# Patient Record
Sex: Female | Born: 1990 | Race: Black or African American | Hispanic: No | Marital: Single | Smoking: Never smoker
Health system: Southern US, Community
[De-identification: ages and names within clinical notes are randomized; demographics above are authoritative.]

## PROBLEM LIST (undated history)

## (undated) ENCOUNTER — Inpatient Hospital Stay (HOSPITAL_COMMUNITY): Payer: Self-pay

## (undated) DIAGNOSIS — Z789 Other specified health status: Secondary | ICD-10-CM

## (undated) HISTORY — PX: NO PAST SURGERIES: SHX2092

## (undated) HISTORY — PX: WISDOM TOOTH EXTRACTION: SHX21

---
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
## 2009-02-15 ENCOUNTER — Ambulatory Visit: Payer: Self-pay | Admitting: Family Medicine

## 2009-02-15 ENCOUNTER — Encounter (INDEPENDENT_AMBULATORY_CARE_PROVIDER_SITE_OTHER): Payer: Self-pay | Admitting: Family Medicine

## 2009-02-15 DIAGNOSIS — N946 Dysmenorrhea, unspecified: Secondary | ICD-10-CM | POA: Insufficient documentation

## 2009-02-15 LAB — CONVERTED CEMR LAB
GC Probe Amp, Genital: NEGATIVE
Whiff Test: NEGATIVE

## 2009-02-17 ENCOUNTER — Encounter (INDEPENDENT_AMBULATORY_CARE_PROVIDER_SITE_OTHER): Payer: Self-pay | Admitting: Family Medicine

## 2010-02-23 ENCOUNTER — Emergency Department (HOSPITAL_COMMUNITY): Admission: EM | Admit: 2010-02-23 | Discharge: 2010-02-23 | Payer: Self-pay | Admitting: Emergency Medicine

## 2010-12-12 ENCOUNTER — Encounter: Payer: Self-pay | Admitting: *Deleted

## 2012-06-05 ENCOUNTER — Encounter: Payer: Self-pay | Admitting: Family Medicine

## 2012-06-05 ENCOUNTER — Ambulatory Visit (INDEPENDENT_AMBULATORY_CARE_PROVIDER_SITE_OTHER): Payer: Self-pay | Admitting: Family Medicine

## 2012-06-05 VITALS — BP 114/80 | HR 80 | Ht 63.0 in | Wt 114.0 lb

## 2012-06-05 DIAGNOSIS — N946 Dysmenorrhea, unspecified: Secondary | ICD-10-CM

## 2012-06-05 DIAGNOSIS — IMO0001 Reserved for inherently not codable concepts without codable children: Secondary | ICD-10-CM

## 2012-06-05 DIAGNOSIS — Z309 Encounter for contraceptive management, unspecified: Secondary | ICD-10-CM

## 2012-06-05 LAB — POCT URINE PREGNANCY: Preg Test, Ur: NEGATIVE

## 2012-06-05 MED ORDER — NORGESTIMATE-ETH ESTRADIOL 0.25-35 MG-MCG PO TABS: 1.0000 | ORAL_TABLET | Freq: Every day | ORAL | Status: DC

## 2012-06-05 NOTE — Progress Notes (Signed)
  Subjective:    Patient ID: Jamie Wells, female    DOB: 1991-09-17, 21 y.o.   MRN: [id number]  HPI Patient here for dysmenorrhea complaint.  Last seen in this office in April 2010 by Dr. Luz Brazen, thus a new visit today.   She reports that she had done well with generic Sprintec at the time of her last visit.  Took for 4 months and then ran out.  Has not been on other forms of hormonal BC since then.  Had been sexually active with boyfriend, with whom she just broke up.  Not sexually active now, is not anticipating being sexually active now. No hx of STI, no hx pregnancy.  No other hormonal OCPs before the Sprintec in 2010.   Had PAP smear then that was normal.  No other PAP since then.   PMHx; None; no hospitalizations, no recent illnesses, no medications, no med allergies.   Family Hx; No DM, no forms of cancer; MGM with HTN.   No VTE events in family.   Social Hx: Works two jobs now [employer]).  Never smoked.  No alcohol or drugs. Not sexually active.    Review of Systems  No fevers/chills, no cough, no dyspnea, no chest pain, no diarrhea, no dysuria.  LMP was July 2-5, 2013.  Penultimate MP June 1-4, 2013.  May vary by 2-3 days each cycle.  Accompanied by pelvic pain.  First day heavy bleeding, then tapers off over next 2-3 days.      Objective:   Physical Exam Well appearing, no apparent distress HEENT Neck supple without adenopathy COR Regular S1S2 PULM Clear bilaterally.  EXTS no calf tenderness, no cords or erythema.        Assessment & Plan:

## 2012-06-05 NOTE — Assessment & Plan Note (Signed)
Patient requests OCPs for this.  She is not sexually active at this time.  No prior STI history, no sxs of STI.  Will refill Sprintec on $9 plan at [facility]; she is to return in 2 months for Pap and to discuss whether OCPs are helping with dysmenorrhea.  UPreg today is negative.  STI screening at time of Pap as well.  She agrees to plan.

## 2012-06-05 NOTE — Patient Instructions (Addendum)
It was a pleasure to see you today.  I sent the prescription for generic Sprintec to the [facility] on [address].  I would like to have you back for a pap smear in about 2 months, to see if your cycles are regulated well and the pain is controlled.

## 2012-08-04 ENCOUNTER — Ambulatory Visit: Payer: Self-pay | Admitting: Family Medicine

## 2012-12-24 ENCOUNTER — Other Ambulatory Visit: Payer: Self-pay | Admitting: Family Medicine

## 2013-01-24 ENCOUNTER — Other Ambulatory Visit: Payer: Self-pay | Admitting: Family Medicine

## 2013-01-25 ENCOUNTER — Telehealth: Payer: Self-pay | Admitting: Family Medicine

## 2013-01-25 MED ORDER — NORGESTIMATE-ETH ESTRADIOL 0.25-35 MG-MCG PO TABS: ORAL_TABLET | ORAL | Status: DC

## 2013-01-25 NOTE — Telephone Encounter (Signed)
To [facility] red team - please call pt and let her know I have refilled one month of her birth control, but she should schedule an appointment to be seen for further refills. She is due for a pap smear and was supposed to follow up around September 2013 but has not been seen since July 2013.

## 2013-01-25 NOTE — Telephone Encounter (Signed)
Left message for patient to return call, please read message from Dr McIntyre.Busick, Robert Lee  

## 2013-01-28 NOTE — Telephone Encounter (Signed)
Called again and left message to call us back. (do not see upcoming appt) .Jamie Wells

## 2013-02-21 ENCOUNTER — Other Ambulatory Visit: Payer: Self-pay | Admitting: Family Medicine

## 2013-02-22 ENCOUNTER — Other Ambulatory Visit: Payer: Self-pay | Admitting: Family Medicine

## 2013-02-22 ENCOUNTER — Encounter: Payer: Self-pay | Admitting: Family Medicine

## 2013-02-22 MED ORDER — NORGESTIMATE-ETH ESTRADIOL 0.25-35 MG-MCG PO TABS: ORAL_TABLET | ORAL | Status: DC

## 2013-04-11 ENCOUNTER — Other Ambulatory Visit: Payer: Self-pay | Admitting: Family Medicine

## 2013-06-11 ENCOUNTER — Ambulatory Visit (INDEPENDENT_AMBULATORY_CARE_PROVIDER_SITE_OTHER): Payer: Self-pay | Admitting: Family Medicine

## 2013-06-11 ENCOUNTER — Other Ambulatory Visit (HOSPITAL_COMMUNITY)
Admission: RE | Admit: 2013-06-11 | Discharge: 2013-06-11 | Disposition: A | Discharge status: Home / Self Care | Payer: Self-pay | Source: Ambulatory Visit | Attending: Family Medicine | Admitting: Family Medicine

## 2013-06-11 ENCOUNTER — Encounter: Payer: Self-pay | Admitting: Family Medicine

## 2013-06-11 VITALS — BP 122/84 | HR 90 | Ht 63.0 in | Wt 118.0 lb

## 2013-06-11 DIAGNOSIS — Z113 Encounter for screening for infections with a predominantly sexual mode of transmission: Secondary | ICD-10-CM

## 2013-06-11 DIAGNOSIS — Z309 Encounter for contraceptive management, unspecified: Secondary | ICD-10-CM

## 2013-06-11 DIAGNOSIS — Z01419 Encounter for gynecological examination (general) (routine) without abnormal findings: Secondary | ICD-10-CM | POA: Insufficient documentation

## 2013-06-11 DIAGNOSIS — Z124 Encounter for screening for malignant neoplasm of cervix: Secondary | ICD-10-CM

## 2013-06-11 DIAGNOSIS — N898 Other specified noninflammatory disorders of vagina: Secondary | ICD-10-CM

## 2013-06-11 DIAGNOSIS — R011 Cardiac murmur, unspecified: Secondary | ICD-10-CM | POA: Insufficient documentation

## 2013-06-11 LAB — POCT URINALYSIS DIPSTICK
Glucose, UA: NEGATIVE
Urobilinogen, UA: 0.2

## 2013-06-11 LAB — POCT WET PREP (WET MOUNT): Clue Cells Wet Prep Whiff POC: NEGATIVE

## 2013-06-11 MED ORDER — FLUCONAZOLE 150 MG PO TABS: 150.0000 mg | ORAL_TABLET | Freq: Once | ORAL | Status: DC

## 2013-06-11 MED ORDER — NORGESTIMATE-ETH ESTRADIOL 0.25-35 MG-MCG PO TABS: ORAL_TABLET | ORAL | Status: DC

## 2013-06-11 NOTE — Assessment & Plan Note (Signed)
Slight flow murmur auscultated on exam today. Patient is asymptomatic. Will continue to monitor at future visits. No workup indicated at this time.

## 2013-06-11 NOTE — Progress Notes (Signed)
Patient ID: Jamie Wells, female   DOB: 1990/11/15, 22 y.o.   MRN: [id number]   HPI:  Well woman visit: here for pap smear and to get birth control refilled. Is not currently sexually active. Was sexually active at one time with one partner, but not now. Interested in STD testing today. Previously was on Sprintec due to pain with periods. The OCP helped a lot. No side effects. No personal or family hx of blood clots. No personal hx of migraine with aura. Denies tobacco use. Not concerned about pregnancy today. Does think she has a yeast infection because she has felt itchy and had white discharge. Some burning with urination. No abdominal pain. Has never had pap smear before. Was good about taking the sprintec every day.  ROS: See HPI. Also, denies chest pain, SOB.   PMFSH: Does not smoke  PHYSICAL EXAM: BP 122/84  Pulse 90  Ht 5\' 3"  (1.6 m)  Wt 118 lb (53.524 kg)  BMI 20.91 kg/m2 Gen: NAD Heart: RRR, soft 2/6 flow murmur loudest when lying down Lungs: CTAB, NWOB Neuro: grossly nonfocal, speech intact GU: normal appearing external genitalia. Normal vaginal mucosa. Some white discharge present in vault. Cervix normal in appearance, consistent with nulliparous cervix. No tenderness on bimanual exam. No adnexal masses appreciable.   ASSESSMENT/PLAN:  # Health maintenance:  -pap smear done today -for STD screening will check wet prep, gc/chlamydia. Had planned to get HIV & RPR, however this didn't get ordered during visit, so will have nursing staff call patient and offer for her to return for HIV/RPR lab draw visit -urine pregnancy test negative, and patient desires refill of OCP. No contraindications present. Refilled for 1 years worth. -given handout on well woman health  # Burning/discharge: -wet prep consistent with yeast infection. Will treat with diflucan 150mg  PO x 1. -also obtain UA due to hx of dysuria, doubt UTI though as hx seems more like vaginal candidiasis

## 2013-06-11 NOTE — Patient Instructions (Addendum)
It was nice to meet you today!  I will call you if your test results are not normal.  Otherwise, I will send you a letter.  If you do not hear from me with in 2 weeks please call our office.  It looks like you have a yeast infection. I sent in a pill for you to take. Call us or come back if it's not improved.  I refilled your birth control for 12 months. Come back in a year for routine follow up, or sooner if you have any problems.  Be well, Dr. Pollie Meyer   Health Maintenance, Females A healthy lifestyle and preventative care can promote health and wellness.  Maintain regular health, dental, and eye exams.  Eat a healthy diet. Foods like vegetables, fruits, whole grains, low-fat dairy products, and lean protein foods contain the nutrients you need without too many calories. Decrease your intake of foods high in solid fats, added sugars, and salt. Get information about a proper diet from your caregiver, if necessary.  Regular physical exercise is one of the most important things you can do for your health. Most adults should get at least 150 minutes of moderate-intensity exercise (any activity that increases your heart rate and causes you to sweat) each week. In addition, most adults need muscle-strengthening exercises on 2 or more days a week.   Maintain a healthy weight. The body mass index (BMI) is a screening tool to identify possible weight problems. It provides an estimate of body fat based on height and weight. Your caregiver can help determine your BMI, and can help you achieve or maintain a healthy weight. For adults 20 years and older:  A BMI below 18.5 is considered underweight.  A BMI of 18.5 to 24.9 is normal.  A BMI of 25 to 29.9 is considered overweight.  A BMI of 30 and above is considered obese.  Maintain normal blood lipids and cholesterol by exercising and minimizing your intake of saturated fat. Eat a balanced diet with plenty of fruits and vegetables. Blood tests  for lipids and cholesterol should begin at age 105 and be repeated every 5 years. If your lipid or cholesterol levels are high, you are over 50, or you are a high risk for heart disease, you may need your cholesterol levels checked more frequently.Ongoing high lipid and cholesterol levels should be treated with medicines if diet and exercise are not effective.  If you smoke, find out from your caregiver how to quit. If you do not use tobacco, do not start.  If you are pregnant, do not drink alcohol. If you are breastfeeding, be very cautious about drinking alcohol. If you are not pregnant and choose to drink alcohol, do not exceed 1 drink per day. One drink is considered to be 12 ounces (355 mL) of beer, 5 ounces (148 mL) of wine, or 1.5 ounces (44 mL) of liquor.  Avoid use of street drugs. Do not share needles with anyone. Ask for help if you need support or instructions about stopping the use of drugs.  High blood pressure causes heart disease and increases the risk of stroke. Blood pressure should be checked at least every 1 to 2 years. Ongoing high blood pressure should be treated with medicines, if weight loss and exercise are not effective.  If you are 54 to 24 years old, ask your caregiver if you should take aspirin to prevent strokes.  Diabetes screening involves taking a blood sample to check your fasting blood sugar level.  This should be done once every 3 years, after age 61, if you are within normal weight and without risk factors for diabetes. Testing should be considered at a younger age or be carried out more frequently if you are overweight and have at least 1 risk factor for diabetes.  Breast cancer screening is essential preventative care for women. You should practice "breast self-awareness." This means understanding the normal appearance and feel of your breasts and may include breast self-examination. Any changes detected, no matter how small, should be reported to a caregiver.  Women in their 43s and 30s should have a clinical breast exam (CBE) by a caregiver as part of a regular health exam every 1 to 3 years. After age 67, women should have a CBE every year. Starting at age 50, women should consider having a mammogram (breast X-ray) every year. Women who have a family history of breast cancer should talk to their caregiver about genetic screening. Women at a high risk of breast cancer should talk to their caregiver about having an MRI and a mammogram every year.  The Pap test is a screening test for cervical cancer. Women should have a Pap test starting at age 54. Between ages 62 and 1, Pap tests should be repeated every 2 years. Beginning at age 71, you should have a Pap test every 3 years as long as the past 3 Pap tests have been normal. If you had a hysterectomy for a problem that was not cancer or a condition that could lead to cancer, then you no longer need Pap tests. If you are between ages 69 and 25, and you have had normal Pap tests going back 10 years, you no longer need Pap tests. If you have had past treatment for cervical cancer or a condition that could lead to cancer, you need Pap tests and screening for cancer for at least 20 years after your treatment. If Pap tests have been discontinued, risk factors (such as a new sexual partner) need to be reassessed to determine if screening should be resumed. Some women have medical problems that increase the chance of getting cervical cancer. In these cases, your caregiver may recommend more frequent screening and Pap tests.  The human papillomavirus (HPV) test is an additional test that may be used for cervical cancer screening. The HPV test looks for the virus that can cause the cell changes on the cervix. The cells collected during the Pap test can be tested for HPV. The HPV test could be used to screen women aged 64 years and older, and should be used in women of any age who have unclear Pap test results. After the age of  58, women should have HPV testing at the same frequency as a Pap test.  Colorectal cancer can be detected and often prevented. Most routine colorectal cancer screening begins at the age of 55 and continues through age 74. However, your caregiver may recommend screening at an earlier age if you have risk factors for colon cancer. On a yearly basis, your caregiver may provide home test kits to check for hidden blood in the stool. Use of a small camera at the end of a tube, to directly examine the colon (sigmoidoscopy or colonoscopy), can detect the earliest forms of colorectal cancer. Talk to your caregiver about this at age 61, when routine screening begins. Direct examination of the colon should be repeated every 5 to 10 years through age 59, unless early forms of pre-cancerous polyps or small  growths are found.  Hepatitis C blood testing is recommended for all people born from [birth year] through 1967 and any individual with known risks for hepatitis C.  Practice safe sex. Use condoms and avoid high-risk sexual practices to reduce the spread of sexually transmitted infections (STIs). Sexually active women aged 78 and younger should be checked for Chlamydia, which is a common sexually transmitted infection. Older women with new or multiple partners should also be tested for Chlamydia. Testing for other STIs is recommended if you are sexually active and at increased risk.  Osteoporosis is a disease in which the bones lose minerals and strength with aging. This can result in serious bone fractures. The risk of osteoporosis can be identified using a bone density scan. Women ages 58 and over and women at risk for fractures or osteoporosis should discuss screening with their caregivers. Ask your caregiver whether you should be taking a calcium supplement or vitamin D to reduce the rate of osteoporosis.  Menopause can be associated with physical symptoms and risks. Hormone replacement therapy is available to decrease  symptoms and risks. You should talk to your caregiver about whether hormone replacement therapy is right for you.  Use sunscreen with a sun protection factor (SPF) of 30 or greater. Apply sunscreen liberally and repeatedly throughout the day. You should seek shade when your shadow is shorter than you. Protect yourself by wearing long sleeves, pants, a wide-brimmed hat, and sunglasses year round, whenever you are outdoors.  Notify your caregiver of new moles or changes in moles, especially if there is a change in shape or color. Also notify your caregiver if a mole is larger than the size of a pencil eraser.  Stay current with your immunizations. Document Released: 05/13/2011 Document Revised: 01/20/2012 Document Reviewed: 05/13/2011 [facility] Patient Information 2014 [place], [place].

## 2013-06-13 ENCOUNTER — Telehealth: Payer: Self-pay | Admitting: Family Medicine

## 2013-06-13 NOTE — Telephone Encounter (Signed)
[facility] red team - can you please call this pt and offer for her to return to get HIV, RPR drawn to complete STD screening? We meant to do this at her visit on Friday but it wasn't drawn before she left. Thanks!

## 2013-06-14 NOTE — Telephone Encounter (Signed)
LMOVM for patient to call our office.  Pt needs lab work.  See MD msg below.  Please schedule pt for a lab visit when she call backs. Thank you.  Virga Haltiwanger, Darlyne Russian, CMA

## 2013-06-14 NOTE — Telephone Encounter (Signed)
Attempted to call pt at home.  No answer.  Con Arganbright, Darlyne Russian, CMA

## 2013-06-16 ENCOUNTER — Encounter: Payer: Self-pay | Admitting: Family Medicine

## 2013-08-06 ENCOUNTER — Other Ambulatory Visit: Payer: Self-pay | Admitting: Family Medicine

## 2015-06-05 ENCOUNTER — Encounter: Payer: Self-pay | Admitting: Family Medicine

## 2015-06-19 ENCOUNTER — Ambulatory Visit (INDEPENDENT_AMBULATORY_CARE_PROVIDER_SITE_OTHER): Payer: PRIVATE HEALTH INSURANCE | Admitting: Family Medicine

## 2015-06-19 ENCOUNTER — Encounter: Payer: Self-pay | Admitting: Family Medicine

## 2015-06-19 VITALS — BP 119/79 | HR 79 | Temp 98.5°F | Ht 63.0 in | Wt 117.9 lb

## 2015-06-19 DIAGNOSIS — R011 Cardiac murmur, unspecified: Secondary | ICD-10-CM

## 2015-06-19 DIAGNOSIS — N946 Dysmenorrhea, unspecified: Secondary | ICD-10-CM

## 2015-06-19 DIAGNOSIS — Z23 Encounter for immunization: Secondary | ICD-10-CM

## 2015-06-19 DIAGNOSIS — Z114 Encounter for screening for human immunodeficiency virus [HIV]: Secondary | ICD-10-CM | POA: Diagnosis not present

## 2015-06-19 MED ORDER — NORGESTIMATE-ETH ESTRADIOL 0.25-35 MG-MCG PO TABS: ORAL_TABLET | ORAL | Status: DC

## 2015-06-19 NOTE — Assessment & Plan Note (Signed)
Refill OCP

## 2015-06-19 NOTE — Progress Notes (Signed)
Patient ID: Jamie Wells, female   DOB: October 12, 1991, 24 y.o.   MRN: [id number] Well woman visit: Here for birth control refill. Has not been taking this for the past year but has continued to have pain with periods which are generally regular, lasting 3 - 6 days. She takes NSAIDs as needed. No sexual partners, declines STI screening. Reports good control with sprintec OCP without side effects and would like this same medication again. No personal or family hx of blood clots. No personal hx of migraine with aura. No dyspnea, leg swelling, vaginal bleeding, discharge, or irritation. No dysuria.   ROS: as above PMFSH: Non-smoker, no FH clots. Does not believe she's had a tetanus shot in the past 10 years.   PHYSICAL EXAM: BP 119/79 mmHg  Pulse 79  Temp(Src) 98.5 F (36.9 C) (Oral)  Ht  (1.6 m)  Wt 117 lb 14.4 oz (53.479 kg)  BMI 20.89 kg/m2  LMP 05/31/2015  Gen: NAD Heart: RRR, no murmur (as noted previously) was heard Lungs: CTAB, NWOB  ASSESSMENT/PLAN: Jamie Wells is a 24 y.o. female with dysmenorrhea, requesting birth control pills. - Refill OCPs x 12 months - Return for pap smear Aug 2017 - HIV screening - TDaP today

## 2015-06-19 NOTE — Patient Instructions (Signed)
Thank you for coming in today!  I have refilled a year worth of sprintec - sent to your pharmacy. Your next pap smear will be due in 2017.   Our clinic's number is [phone]. Feel free to call any time with questions or concerns. We will answer any questions after hours with our 24-hour emergency line at that number as well.   - Dr. Jarvis Newcomer

## 2015-06-19 NOTE — Addendum Note (Signed)
Addended by: Pamelia Hoit on: 06/19/2015 04:40 PM   Modules accepted: Orders

## 2015-06-19 NOTE — Assessment & Plan Note (Signed)
Likely flow murmur as it is absent on today's exam.

## 2015-06-20 ENCOUNTER — Telehealth: Payer: Self-pay | Admitting: Family Medicine

## 2015-06-20 DIAGNOSIS — N946 Dysmenorrhea, unspecified: Secondary | ICD-10-CM

## 2015-06-20 LAB — HIV ANTIBODY (ROUTINE TESTING W REFLEX): HIV: NONREACTIVE

## 2015-06-20 MED ORDER — NORGESTIMATE-ETH ESTRADIOL 0.25-35 MG-MCG PO TABS: ORAL_TABLET | ORAL | Status: DC

## 2015-06-20 NOTE — Telephone Encounter (Signed)
LVMM for pt to call the office. Please inform her of the below information. Sunday Spillers, CMA

## 2015-06-20 NOTE — Telephone Encounter (Signed)
HIV negative, pt asked for phone communication of result.

## 2015-06-20 NOTE — Telephone Encounter (Signed)
Pt called and would like [place] to send her [facility] Sprintec to the [facility] at [facility]. jw

## 2016-01-30 ENCOUNTER — Encounter (HOSPITAL_COMMUNITY): Payer: Self-pay | Admitting: Emergency Medicine

## 2016-01-30 ENCOUNTER — Emergency Department (HOSPITAL_COMMUNITY)
Admission: EM | Admit: 2016-01-30 | Discharge: 2016-01-30 | Disposition: A | Discharge status: Home / Self Care | Payer: Self-pay | Attending: Emergency Medicine | Admitting: Emergency Medicine

## 2016-01-30 ENCOUNTER — Emergency Department (HOSPITAL_COMMUNITY): Payer: Self-pay

## 2016-01-30 DIAGNOSIS — J301 Allergic rhinitis due to pollen: Secondary | ICD-10-CM | POA: Insufficient documentation

## 2016-01-30 DIAGNOSIS — Z79899 Other long term (current) drug therapy: Secondary | ICD-10-CM | POA: Insufficient documentation

## 2016-01-30 DIAGNOSIS — J302 Other seasonal allergic rhinitis: Secondary | ICD-10-CM

## 2016-01-30 DIAGNOSIS — J069 Acute upper respiratory infection, unspecified: Secondary | ICD-10-CM | POA: Insufficient documentation

## 2016-01-30 MED ORDER — SALINE SPRAY 0.65 % NA SOLN: 1.0000 | NASAL | Status: DC | PRN

## 2016-01-30 MED ORDER — LORATADINE 10 MG PO TABS: 10.0000 mg | ORAL_TABLET | Freq: Every day | ORAL | Status: DC

## 2016-01-30 MED ORDER — FLUTICASONE PROPIONATE 50 MCG/ACT NA SUSP: 2.0000 | Freq: Every day | NASAL | Status: DC

## 2016-01-30 NOTE — ED Notes (Signed)
Patient transported to X-ray 

## 2016-01-30 NOTE — ED Notes (Signed)
Pt. reports productive cough with chest congestion ,  sneezing , runny nose and nasal congestion onset 2 weeks ago , denies fever or chills. Respirations unlabored.

## 2016-01-30 NOTE — ED Provider Notes (Signed)
CSN: [id number]     Arrival date & time 01/30/16  0321 History   First MD Initiated Contact with Patient 01/30/16 [phone]     Chief Complaint  Patient presents with  . Cough  . Nasal Congestion     (Consider location/radiation/quality/duration/timing/severity/associated sxs/prior Treatment) HPI  [name] who presents with three-week history of cough, congestion, sneezing, runny nose. Patient also noted tearing of the left eye. She has taken multiple over-the-counter medications without relief. Denies fevers. Denies shortness of breath or chest pain. Denies sore throat. No known history of seasonal allergies.  History reviewed. No pertinent past medical history. History reviewed. No pertinent past surgical history. No family history on file. Social History  Substance Use Topics  . Smoking status: Never Smoker   . Smokeless tobacco: None  . Alcohol Use: Yes   OB History    No data available     Review of Systems  Constitutional: Negative for fever.  HENT: Positive for congestion, rhinorrhea, sinus pressure and sneezing. Negative for ear pain and sore throat.   Respiratory: Positive for cough. Negative for shortness of breath.   Cardiovascular: Negative for chest pain.  All other systems reviewed and are negative.     Allergies  Review of patient's allergies indicates no known allergies.  Home Medications   Prior to Admission medications   Medication Sig Start Date End Date Taking? Authorizing Provider  fluticasone (FLONASE) 50 MCG/ACT nasal spray Place 2 sprays into both nostrils daily. 01/30/16   Shon Batonourtney F Horton, MD  loratadine (CLARITIN) 10 MG tablet Take 1 tablet (10 mg total) by mouth daily. 01/30/16   Shon Batonourtney F Horton, MD  norgestimate-ethinyl estradiol (SPRINTEC 28) 0.25-35 MG-MCG tablet TAKE ONE TABLET BY MOUTH EVERY DAY AT THE SAME TIME 06/20/15   Tyrone Nineyan B Grunz, MD  sodium chloride (OCEAN) 0.65 % SOLN nasal spray Place 1 spray into both nostrils as  needed for congestion. 01/30/16   Shon Batonourtney F Horton, MD   BP 116/78 mmHg  Pulse 81  Temp(Src) 98.2 F (36.8 C) (Oral)  Resp 18  Ht 5\' 3"  (1.6 m)  Wt 118 lb (53.524 kg)  BMI 20.91 kg/m2  SpO2 100%  LMP 01/29/2016 Physical Exam  Constitutional: She is oriented to person, place, and time. She appears well-developed and well-nourished. No distress.  HENT:  Head: Normocephalic and atraumatic.  Bilateral TMs normal, oropharynx moist and clear  Eyes: Conjunctivae are normal. Pupils are equal, round, and reactive to light.  Mild tearing noted of the left eye  Neck: Neck supple.  Cardiovascular: Normal rate, regular rhythm and normal heart sounds.   No murmur heard. Pulmonary/Chest: Effort normal and breath sounds normal. No respiratory distress. She has no wheezes.  Abdominal: Soft. There is no tenderness.  Neurological: She is alert and oriented to person, place, and time.  Skin: Skin is warm and dry.  Psychiatric: She has a normal mood and affect.  Nursing note and vitals reviewed.   ED Course  Procedures (including critical care time) Labs Review Labs Reviewed - No data to display  Imaging Review Dg Chest 2 View  01/30/2016  CLINICAL DATA:  Productive cough for 3 weeks. EXAM: CHEST  2 VIEW COMPARISON:  None. FINDINGS: The heart size and mediastinal contours are within normal limits. Both lungs are clear. The visualized skeletal structures are remarkable only for thoracolumbar scoliosis. IMPRESSION: No active cardiopulmonary disease. Electronically Signed   By: Ellery Plunkaniel R Mitchell M.D.   On: 01/30/2016 06:51   I  have personally reviewed and evaluated these images and lab results as part of my medical decision-making.   EKG Interpretation None      MDM   Final diagnoses:  Upper respiratory infection  Seasonal allergies    Patient presents with multiple upper respiratory complaints. Ongoing over the last 2-3 weeks. Nontoxic on exam. Feel this is likely a combination of  seasonal allergies versus viral URI. Given the sneezing and watery eyes, seasonal allergies is most likely. X-ray shows no evidence of pneumonia. Discussed with patient supportive care with Flonase, nasal saline, and Claritin. Patient stated understanding.  After history, exam, and medical workup I feel the patient has been appropriately medically screened and is safe for discharge home. Pertinent diagnoses were discussed with the patient. Patient was given return precautions.     Shon Baton, MD 01/31/16 [phone]

## 2016-01-30 NOTE — Discharge Instructions (Signed)
Allergic Rhinitis °Allergic rhinitis is when the mucous membranes in the nose respond to allergens. Allergens are particles in the air that cause your body to have an allergic reaction. This causes you to release allergic antibodies. Through a chain of events, these eventually cause you to release histamine into the blood stream. Although meant to protect the body, it is this release of histamine that causes your discomfort, such as frequent sneezing, congestion, and an itchy, runny nose.  °CAUSES °Seasonal allergic rhinitis (hay fever) is caused by pollen allergens that may come from grasses, trees, and weeds. Year-round allergic rhinitis (perennial allergic rhinitis) is caused by allergens such as house dust mites, pet dander, and mold spores. °SYMPTOMS °· Nasal stuffiness (congestion). °· Itchy, runny nose with sneezing and tearing of the eyes. °DIAGNOSIS °Your health care provider can help you determine the allergen or allergens that trigger your symptoms. If you and your health care provider are unable to determine the allergen, skin or blood testing may be used. Your health care provider will diagnose your condition after taking your health history and performing a physical exam. Your health care provider may assess you for other related conditions, such as asthma, pink eye, or an ear infection. °TREATMENT °Allergic rhinitis does not have a cure, but it can be controlled by: °· Medicines that block allergy symptoms. These may include allergy shots, nasal sprays, and oral antihistamines. °· Avoiding the allergen. °Hay fever may often be treated with antihistamines in pill or nasal spray forms. Antihistamines block the effects of histamine. There are over-the-counter medicines that may help with nasal congestion and swelling around the eyes. Check with your health care provider before taking or giving this medicine. °If avoiding the allergen or the medicine prescribed do not work, there are many new medicines  your health care provider can prescribe. Stronger medicine may be used if initial measures are ineffective. Desensitizing injections can be used if medicine and avoidance does not work. Desensitization is when a patient is given ongoing shots until the body becomes less sensitive to the allergen. Make sure you follow up with your health care provider if problems continue. °HOME CARE INSTRUCTIONS °It is not possible to completely avoid allergens, but you can reduce your symptoms by taking steps to limit your exposure to them. It helps to know exactly what you are allergic to so that you can avoid your specific triggers. °SEEK MEDICAL CARE IF: °· You have a fever. °· You develop a cough that does not stop easily (persistent). °· You have shortness of breath. °· You start wheezing. °· Symptoms interfere with normal daily activities. °  °This information is not intended to replace advice given to you by your health care provider. Make sure you discuss any questions you have with your health care provider. °  °Document Released: 07/23/2001 Document Revised: 11/18/2014 Document Reviewed: 07/05/2013 °Elsevier Interactive Patient Education ©2016 [employer] °Upper Respiratory Infection, Adult °Most upper respiratory infections (URIs) are a viral infection of the air passages leading to the lungs. A URI affects the nose, throat, and upper air passages. The most common type of URI is nasopharyngitis and is typically referred to as "the common cold." °URIs run their course and usually go away on their own. Most of the time, a URI does not require medical attention, but sometimes a bacterial infection in the upper airways can follow a viral infection. This is called a secondary infection. Sinus and middle ear infections are common types of secondary upper respiratory infections. °  Bacterial pneumonia can also complicate a URI. A URI can worsen asthma and chronic obstructive pulmonary disease (COPD). Sometimes, these  complications can require emergency medical care and may be life threatening.  °CAUSES °Almost all URIs are caused by viruses. A virus is a type of germ and can spread from one person to another.  °RISKS FACTORS °You may be at risk for a URI if:  °· You smoke.   °· You have chronic heart or lung disease. °· You have a weakened defense (immune) system.   °· You are very young or very old.   °· You have nasal allergies or asthma. °· You work in crowded or poorly ventilated areas. °· You work in health care facilities or schools. °SIGNS AND SYMPTOMS  °Symptoms typically develop 2-3 days after you come in contact with a cold virus. Most viral URIs last 7-10 days. However, viral URIs from the influenza virus (flu virus) can last 14-18 days and are typically more severe. Symptoms may include:  °· Runny or stuffy (congested) nose.   °· Sneezing.   °· Cough.   °· Sore throat.   °· Headache.   °· Fatigue.   °· Fever.   °· Loss of appetite.   °· Pain in your forehead, behind your eyes, and over your cheekbones (sinus pain). °· Muscle aches.   °DIAGNOSIS  °Your health care provider may diagnose a URI by: °· Physical exam. °· Tests to check that your symptoms are not due to another condition such as: °¨ Strep throat. °¨ Sinusitis. °¨ Pneumonia. °¨ Asthma. °TREATMENT  °A URI goes away on its own with time. It cannot be cured with medicines, but medicines may be prescribed or recommended to relieve symptoms. Medicines may help: °· Reduce your fever. °· Reduce your cough. °· Relieve nasal congestion. °HOME CARE INSTRUCTIONS  °· Take medicines only as directed by your health care provider.   °· Gargle warm saltwater or take cough drops to comfort your throat as directed by your health care provider. °· Use a warm mist humidifier or inhale steam from a shower to increase air moisture. This may make it easier to breathe. °· Drink enough fluid to keep your urine clear or pale yellow.   °· Eat soups and other clear broths and maintain  good nutrition.   °· Rest as needed.   °· Return to work when your temperature has returned to normal or as your health care provider advises. You may need to stay home longer to avoid infecting others. You can also use a face mask and careful hand washing to prevent spread of the virus. °· Increase the usage of your inhaler if you have asthma.   °· Do not use any tobacco products, including cigarettes, chewing tobacco, or electronic cigarettes. If you need help quitting, ask your health care provider. °PREVENTION  °The best way to protect yourself from getting a cold is to practice good hygiene.  °· Avoid oral or hand contact with people with cold symptoms.   °· Wash your hands often if contact occurs.   °There is no clear evidence that vitamin C, vitamin E, echinacea, or exercise reduces the chance of developing a cold. However, it is always recommended to get plenty of rest, exercise, and practice good nutrition.  °SEEK MEDICAL CARE IF:  °· You are getting worse rather than better.   °· Your symptoms are not controlled by medicine.   °· You have chills. °· You have worsening shortness of breath. °· You have brown or red mucus. °· You have yellow or brown nasal discharge. °· You have pain in your   face, especially when you bend forward. °· You have a fever. °· You have swollen neck glands. °· You have pain while swallowing. °· You have white areas in the back of your throat. °SEEK IMMEDIATE MEDICAL CARE IF:  °· You have severe or persistent: °¨ Headache. °¨ Ear pain. °¨ Sinus pain. °¨ Chest pain. °· You have chronic lung disease and any of the following: °¨ Wheezing. °¨ Prolonged cough. °¨ Coughing up blood. °¨ A change in your usual mucus. °· You have a stiff neck. °· You have changes in your: °¨ Vision. °¨ Hearing. °¨ Thinking. °¨ Mood. °MAKE SURE YOU:  °· Understand these instructions. °· Will watch your condition. °· Will get help right away if you are not doing well or get worse. °  °This information is not  intended to replace advice given to you by your health care provider. Make sure you discuss any questions you have with your health care provider. °  °Document Released: 04/23/2001 Document Revised: 03/14/2015 Document Reviewed: 02/02/2014 °Elsevier Interactive Patient Education ©2016 [employer]vier Inc. ° °

## 2017-02-28 ENCOUNTER — Encounter (HOSPITAL_COMMUNITY): Payer: Self-pay | Admitting: *Deleted

## 2017-02-28 ENCOUNTER — Emergency Department (HOSPITAL_COMMUNITY)
Admission: EM | Admit: 2017-02-28 | Discharge: 2017-02-28 | Disposition: A | Discharge status: Home / Self Care | Payer: Commercial Managed Care - HMO | Attending: Physician Assistant | Admitting: Physician Assistant

## 2017-02-28 ENCOUNTER — Emergency Department (HOSPITAL_COMMUNITY): Payer: Commercial Managed Care - HMO

## 2017-02-28 DIAGNOSIS — S299XXA Unspecified injury of thorax, initial encounter: Secondary | ICD-10-CM | POA: Diagnosis not present

## 2017-02-28 DIAGNOSIS — Y999 Unspecified external cause status: Secondary | ICD-10-CM | POA: Diagnosis not present

## 2017-02-28 DIAGNOSIS — Y939 Activity, unspecified: Secondary | ICD-10-CM | POA: Insufficient documentation

## 2017-02-28 DIAGNOSIS — Y92410 Unspecified street and highway as the place of occurrence of the external cause: Secondary | ICD-10-CM | POA: Diagnosis not present

## 2017-02-28 DIAGNOSIS — M546 Pain in thoracic spine: Secondary | ICD-10-CM | POA: Diagnosis not present

## 2017-02-28 DIAGNOSIS — R079 Chest pain, unspecified: Secondary | ICD-10-CM | POA: Diagnosis not present

## 2017-02-28 MED ORDER — IBUPROFEN 800 MG PO TABS: 800.0000 mg | ORAL_TABLET | Freq: Three times a day (TID) | ORAL | 0 refills | Status: DC

## 2017-02-28 MED ORDER — METHOCARBAMOL 500 MG PO TABS: 500.0000 mg | ORAL_TABLET | Freq: Two times a day (BID) | ORAL | 0 refills | Status: DC

## 2017-02-28 NOTE — ED Notes (Signed)
Returned from xray
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
## 2017-02-28 NOTE — ED Notes (Signed)
Patient transported to X-ray 
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
## 2017-08-09 DIAGNOSIS — N10 Acute tubulo-interstitial nephritis: Secondary | ICD-10-CM | POA: Diagnosis not present

## 2017-09-12 ENCOUNTER — Encounter: Payer: Self-pay | Admitting: Student in an Organized Health Care Education/Training Program

## 2017-09-12 ENCOUNTER — Ambulatory Visit (INDEPENDENT_AMBULATORY_CARE_PROVIDER_SITE_OTHER): Payer: 59 | Admitting: Student in an Organized Health Care Education/Training Program

## 2017-09-12 VITALS — BP 116/70 | Temp 98.5°F | Ht 63.0 in | Wt 128.2 lb

## 2017-09-12 DIAGNOSIS — Z3041 Encounter for surveillance of contraceptive pills: Secondary | ICD-10-CM | POA: Diagnosis not present

## 2017-09-12 DIAGNOSIS — Z23 Encounter for immunization: Secondary | ICD-10-CM

## 2017-09-12 DIAGNOSIS — R109 Unspecified abdominal pain: Secondary | ICD-10-CM | POA: Diagnosis not present

## 2017-09-12 MED ORDER — NORGESTIMATE-ETH ESTRADIOL 0.25-35 MG-MCG PO TABS: ORAL_TABLET | ORAL | 11 refills | Status: DC

## 2017-09-12 NOTE — Assessment & Plan Note (Signed)
Intermittent sharp pain every 2 or 3 days lasting only 2 or 3 minutes that stays in the right flank is not consistent with a kidney stone. No CVA tenderness to suggest pyelonephritis. No urinary symptoms to think of urinary tract infection. 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 - Conservative management this time with stretching, staying hydrated - Return precautions were advised - Discussed red flags

## 2017-09-12 NOTE — Assessment & Plan Note (Signed)
-   Refilled Sprintec

## 2017-09-12 NOTE — Patient Instructions (Addendum)
It was a pleasure seeing you today in our clinic. Today we discussed your back pain. Here is the treatment plan we have discussed and agreed upon together: - Your back pain does NOT sound like a kidney stone at this time. It is more consistent with muscle pain - If you develop burning with urination, blood in the urine, or fevers, these would be reasons to come back in to be seen  Please schedule a follow up visit so we can do a PAP!  Our clinic's number is [phone]. Please call with questions or concerns about what we discussed today.  Be well, Dr. Mosetta PuttFeng

## 2017-09-12 NOTE — Progress Notes (Signed)
   CC: Right flank pain  HPI: Jamie Wells is a 26 y.o. female with PMH significant for eczema who presents to [facility] today with right flank pain of 2 months duration.   Pain in right flank Had a bladder infection 2 months ago so this was treated with antibiotics at urgent care. At the time because she had flank pain, it was mentioned that she may have a kidney stone and that she should follow-up with her PCP. Since that time she denies having any dysuria, no hematuria, no urinary urgency or frequency. The pain stays in her right flank, a 7 out of 10 in severity and lasts 2 or 3 minutes, does not radiate. Then the pain goes away for a couple days. It comes on every few days. 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  Patient denies any recent fevers. No N/V/D/C.  Contraceptive management - Patient recently managed on OCPs, requests a refill - States she's been off this medication for a while - Last period was 10/15, states periods are regular - Has not been sexually active in the last month and denies any possibility that she could currently pregnant  Review of Symptoms:  See HPI for ROS.   CC, SH/smoking status, and VS noted.  Objective: BP 116/70   Temp 98.5 F (36.9 C)   Ht 5\' 3"  (1.6 m)   Wt 128 lb 3.2 oz (58.2 kg)   LMP 08/25/2017 (Exact Date)   SpO2 97%   BMI 22.71 kg/m  GEN: NAD, alert, cooperative, and pleasant. RESPIRATORY: clear to auscultation bilaterally with no wheezes, rhonchi or rales, good effort CV: RRR, no m/r/g, no peripheral edema GI: soft, non-tender, non-distended, no hepatosplenomegaly GU: No CVA tenderness SKIN: warm and dry, no rashes or lesions PSYCH: AAOx3, appropriate affect  Flu Vaccine:  Given today  Assessment and plan:  Flank pain Intermittent sharp pain every 2 or 3 days lasting only 2 or 3 minutes that stays in the right flank is not consistent with a kidney stone. No CVA tenderness to  suggest pyelonephritis. No urinary symptoms to think of urinary tract infection. 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 - Conservative management this time with stretching, staying hydrated - Return precautions were advised - Discussed red flags  Encounter for surveillance of contraceptive pills - Refilled Sprintec   Meds ordered this encounter  Medications  . norgestimate-ethinyl estradiol (SPRINTEC 28) 0.25-35 MG-MCG tablet    Sig: TAKE ONE TABLET BY MOUTH EVERY DAY AT THE SAME TIME    Dispense:  28 tablet    Refill:  11    Howard PouchLauren Oniel Meleski, MD,MS,  PGY2 09/12/2017 2:52 PM

## 2017-09-18 ENCOUNTER — Ambulatory Visit: Payer: PRIVATE HEALTH INSURANCE | Admitting: Student in an Organized Health Care Education/Training Program

## 2017-12-01 ENCOUNTER — Encounter (HOSPITAL_COMMUNITY): Payer: Self-pay | Admitting: Emergency Medicine

## 2017-12-01 ENCOUNTER — Ambulatory Visit (HOSPITAL_COMMUNITY)
Admission: EM | Admit: 2017-12-01 | Discharge: 2017-12-01 | Disposition: A | Discharge status: Home / Self Care | Payer: 59 | Attending: Family Medicine | Admitting: Family Medicine

## 2017-12-01 ENCOUNTER — Other Ambulatory Visit: Payer: Self-pay

## 2017-12-01 DIAGNOSIS — R109 Unspecified abdominal pain: Secondary | ICD-10-CM | POA: Insufficient documentation

## 2017-12-01 DIAGNOSIS — K5900 Constipation, unspecified: Secondary | ICD-10-CM

## 2017-12-01 DIAGNOSIS — K219 Gastro-esophageal reflux disease without esophagitis: Secondary | ICD-10-CM | POA: Diagnosis not present

## 2017-12-01 LAB — POCT URINALYSIS DIP (DEVICE)
Bilirubin Urine: NEGATIVE
Glucose, UA: NEGATIVE mg/dL
Ketones, ur: NEGATIVE mg/dL
NITRITE: NEGATIVE
PH: 7 (ref 5.0–8.0)
Protein, ur: NEGATIVE mg/dL
Specific Gravity, Urine: 1.015 (ref 1.005–1.030)
UROBILINOGEN UA: 0.2 mg/dL (ref 0.0–1.0)

## 2017-12-01 LAB — POCT PREGNANCY, URINE: Preg Test, Ur: NEGATIVE

## 2017-12-01 MED ORDER — OMEPRAZOLE 20 MG PO CPDR: 20.0000 mg | DELAYED_RELEASE_CAPSULE | Freq: Every day | ORAL | 0 refills | Status: DC

## 2017-12-01 MED ORDER — DOCUSATE SODIUM 50 MG PO CAPS: 50.0000 mg | ORAL_CAPSULE | Freq: Two times a day (BID) | ORAL | 0 refills | Status: DC

## 2017-12-01 MED ORDER — POLYETHYLENE GLYCOL 3350 17 G PO PACK: 17.0000 g | PACK | Freq: Every day | ORAL | 0 refills | Status: DC

## 2017-12-01 NOTE — ED Provider Notes (Signed)
[facility]    CSN: [id number] Arrival date & time: 12/01/17  1118     History   Chief Complaint Chief Complaint  Patient presents with  . Abdominal Pain    HPI Jamie Wells is a 27 y.o. female.   27 year old female comes in for a few month history of intermittent left upper quadrant abdominal pain.  States pain is dull, aching and last for 5-10 minutes each episode.  She states she is noticed pain improves with eating, deep breathing, burping.  She states pain is worse at night.  And has had nausea during the pain without vomiting.  She has noticed pain is worse after eating spicy foods/fried food.  She has burning sensation in the chest when she has the pain.  She denies fever, chills, night sweats.  Denies urinary symptoms such as frequency, dysuria, hematuria.  She normally has bowel movements twice a day, which consists of hard stools and required straining.  Last bowel movement yesterday.  She is tried Pepto-Bismol with some improvement.  She is noticed some dark stools after Pepto-Bismol, but otherwise denies blood in stool.  Denies weakness, dizziness.      History reviewed. No pertinent past medical history.  Patient Active Problem List   Diagnosis Date Noted  . Flank pain 09/12/2017  . Encounter for surveillance of contraceptive pills 09/12/2017  . ECZEMA, ATOPIC DERMATITIS 01/08/2007    History reviewed. No pertinent surgical history.  OB History    No data available       Home Medications    Prior to Admission medications   Medication Sig Start Date End Date Taking? Authorizing Provider  docusate sodium (COLACE) 50 MG capsule Take 1 capsule (50 mg total) by mouth 2 (two) times daily. 12/01/17   Cathie HoopsYu, Amy V, PA-C  ibuprofen (ADVIL,MOTRIN) 800 MG tablet Take 1 tablet (800 mg total) by mouth 3 (three) times daily. 02/28/17   Law, Waylan BogaAlexandra M, PA-C  norgestimate-ethinyl estradiol (SPRINTEC 28) 0.25-35 MG-MCG tablet TAKE ONE TABLET BY MOUTH EVERY DAY  AT THE SAME TIME 09/12/17   Howard PouchFeng, Lauren, MD  omeprazole (PRILOSEC) 20 MG capsule Take 1 capsule (20 mg total) by mouth daily. 12/01/17   Cathie HoopsYu, Amy V, PA-C  polyethylene glycol (MIRALAX) packet Take 17 g by mouth daily. 12/01/17   Cathie HoopsYu, Amy V, PA-C  sodium chloride (OCEAN) 0.65 % SOLN nasal spray Place 1 spray into both nostrils as needed for congestion. 01/30/16   Horton, Mayer Maskerourtney F, MD    Family History Family History  Problem Relation Age of Onset  . Healthy Mother     Social History Social History   Tobacco Use  . Smoking status: Never Smoker  . Smokeless tobacco: Never Used  Substance Use Topics  . Alcohol use: Yes    Comment: occ  . Drug use: No     Allergies   Patient has no known allergies.   Review of Systems Review of Systems  Reason unable to perform ROS: See HPI as above.     Physical Exam Triage Vital Signs ED Triage Vitals  Enc Vitals Group     BP 12/01/17 1239 129/82     Pulse Rate 12/01/17 1239 73     Resp 12/01/17 1239 18     Temp 12/01/17 1239 97.9 F (36.6 C)     Temp Source 12/01/17 1239 Oral     SpO2 12/01/17 1239 100 %     Weight --      Height --  Head Circumference --      Peak Flow --      Pain Score 12/01/17 1236 9     Pain Loc --      Pain Edu? --      Excl. in GC? --    No data found.  Updated Vital Signs BP 129/82 (BP Location: Left Arm)   Pulse 73   Temp 97.9 F (36.6 C) (Oral)   Resp 18   LMP 11/29/2017   SpO2 100%   Physical Exam  Constitutional: She is oriented to person, place, and time. She appears well-developed and well-nourished. No distress.  HENT:  Head: Normocephalic and atraumatic.  Eyes: Conjunctivae are normal. Pupils are equal, round, and reactive to light.  Cardiovascular: Normal rate, regular rhythm and normal heart sounds. Exam reveals no gallop and no friction rub.  No murmur heard. Pulmonary/Chest: Effort normal and breath sounds normal. She has no wheezes. She has no rales.  Abdominal: Soft. Bowel  sounds are normal. There is no CVA tenderness, no tenderness at McBurney's point and negative Murphy's sign.  Mild tenderness to palpation of left upper and lower quadrant, epigastric region without guarding or rebound.  Neurological: She is alert and oriented to person, place, and time.  Skin: Skin is warm and dry.  Psychiatric: She has a normal mood and affect. Her behavior is normal. Judgment normal.     UC Treatments / Results  Labs (all labs ordered are listed, but only abnormal results are displayed) Labs Reviewed  POCT URINALYSIS DIP (DEVICE) - Abnormal; Notable for the following components:      Result Value   Hgb urine dipstick SMALL (*)    Leukocytes, UA SMALL (*)    All other components within normal limits  URINE CULTURE  POCT PREGNANCY, URINE    EKG  EKG Interpretation None       Radiology No results found.  Procedures Procedures (including critical care time)  Medications Ordered in UC Medications - No data to display   Initial Impression / Assessment and Plan / UC Course  I have reviewed the triage vital signs and the nursing notes.  Pertinent labs & imaging results that were available during my care of the patient were reviewed by me and considered in my medical decision making (see chart for details).    Discussed with patient no alarming signs on exam.  Discussed possible constipation, GERD causing symptoms.  Urine with small leukocytes and blood, will obtain urine culture.  Will start omeprazole.  Colace and MiraLAX to help with constipation.  Push fluids.  Foods to avoid for GERD discussed with patient.  Patient to follow-up with PCP for further evaluation needed.  Return precautions given.  Patient expresses understanding and agrees to plan.  Final Clinical Impressions(s) / UC Diagnoses   Final diagnoses:  Gastroesophageal reflux disease without esophagitis  Constipation, unspecified constipation type    ED Discharge Orders        Ordered     omeprazole (PRILOSEC) 20 MG capsule  Daily     12/01/17 1304    docusate sodium (COLACE) 50 MG capsule  2 times daily     12/01/17 1304    polyethylene glycol (MIRALAX) packet  Daily     12/01/17 1304        Belinda Fisher, PA-C 12/01/17 1314

## 2017-12-01 NOTE — ED Triage Notes (Signed)
Complains of abdominal pain, intermittent.  Upper left quadrant of abdomen.  Patient feels nauseated, no vomiting, no diarrhea

## 2017-12-01 NOTE — ED Notes (Signed)
Sent for clean catch

## 2017-12-01 NOTE — Discharge Instructions (Signed)
No alarming signs on exam.  Symptoms could be due to acid reflux, constipation.  Start omeprazole daily for the next 7-14 days.  I have also attached information on foods to avoid to prevent acid reflux.  If you experience reoccurrence, you can take over the counter zantac as needed for the symptoms. As discussed, abdominal pain could also be due to constipation, you can take Colace, which is a laxative to help constipation, this medication can be strong, and can cause abdominal cramping. You can take miralax afterwards, this is more mild and will not cause as much abdominal symptoms, but can regulate your bowels so you have normal bowel movements without hard stools. As discussed, please follow up with PCP for further evaluation if symptoms not improving/does not resolve. Monitor for any worsening of symptoms, worsening localized abdominal pain, nausea, vomiting, fever, cannot jump/ride in cars because the vibration makes pain a lot worse, go to the emergency department for further evaluation.

## 2017-12-02 LAB — URINE CULTURE

## 2017-12-23 ENCOUNTER — Ambulatory Visit: Payer: 59 | Admitting: Student in an Organized Health Care Education/Training Program

## 2018-03-13 ENCOUNTER — Ambulatory Visit: Payer: 59 | Admitting: Student in an Organized Health Care Education/Training Program

## 2018-04-02 ENCOUNTER — Telehealth: Payer: Self-pay

## 2018-04-02 DIAGNOSIS — N898 Other specified noninflammatory disorders of vagina: Secondary | ICD-10-CM

## 2018-04-02 NOTE — Telephone Encounter (Signed)
Pt requesting rx for yeast infection.  Has appointment next week but was wanting to know if something could be sent in now. Call back [phone] Shawna Orleans, RN

## 2018-04-07 ENCOUNTER — Other Ambulatory Visit: Payer: Self-pay | Admitting: Student in an Organized Health Care Education/Training Program

## 2018-04-07 MED ORDER — FLUCONAZOLE 150 MG PO TABS: 150.0000 mg | ORAL_TABLET | Freq: Every day | ORAL | 0 refills | Status: DC

## 2018-04-07 MED ORDER — FLUCONAZOLE 150 MG PO TABS: 150.0000 mg | ORAL_TABLET | Freq: Once | ORAL | 0 refills | Status: DC

## 2018-04-07 NOTE — Telephone Encounter (Signed)
FM after hour call response: Called patient in response to her page on after hour pager. Patient picked up the phone and verified her name and age.  HPI: patient states that her PCP sent a prescription for Diflucan to wrong pharmacy.  She asks [place] if this prescription can be sent to [facility] pharmacy on [address] [facility] sent to [facility] on [address].  Patient appreciates the call back and has no further question.

## 2018-04-07 NOTE — Progress Notes (Signed)
Called and spoke with patient who endorses vaginal pruritis and discharge consistent with previous yeast infection symptoms. Menstruating currently. Will send diflucan.

## 2018-04-07 NOTE — Telephone Encounter (Signed)
Spoke with patient, sent diflucan.

## 2018-04-10 ENCOUNTER — Other Ambulatory Visit: Payer: Self-pay

## 2018-04-10 ENCOUNTER — Encounter: Payer: 59 | Admitting: Student in an Organized Health Care Education/Training Program

## 2018-04-10 ENCOUNTER — Other Ambulatory Visit (HOSPITAL_COMMUNITY)
Admission: RE | Admit: 2018-04-10 | Discharge: 2018-04-10 | Disposition: A | Discharge status: Home / Self Care | Payer: 59 | Source: Ambulatory Visit | Attending: Family Medicine | Admitting: Family Medicine

## 2018-04-10 ENCOUNTER — Ambulatory Visit (INDEPENDENT_AMBULATORY_CARE_PROVIDER_SITE_OTHER): Payer: 59 | Admitting: Internal Medicine

## 2018-04-10 ENCOUNTER — Encounter: Payer: Self-pay | Admitting: Internal Medicine

## 2018-04-10 VITALS — BP 120/80 | HR 79 | Temp 98.4°F | Wt 124.6 lb

## 2018-04-10 DIAGNOSIS — Z124 Encounter for screening for malignant neoplasm of cervix: Secondary | ICD-10-CM | POA: Insufficient documentation

## 2018-04-10 DIAGNOSIS — Z113 Encounter for screening for infections with a predominantly sexual mode of transmission: Secondary | ICD-10-CM

## 2018-04-10 LAB — POCT WET PREP (WET MOUNT)
CLUE CELLS WET PREP WHIFF POC: NEGATIVE
Trichomonas Wet Prep HPF POC: ABSENT
WBC, Wet Prep HPF POC: >20

## 2018-04-10 NOTE — Patient Instructions (Signed)
Please follow up once yearly

## 2018-04-10 NOTE — Assessment & Plan Note (Signed)
Pap smear + STD screening  - Cytology - PAP([place]) - with G/C - POCT Wet Prep ([place]) - HIV antibody - RPR - Currently using condoms regularly, discussed birth control alternatives + hand out given  - Follow up in 1 year

## 2018-04-10 NOTE — Progress Notes (Signed)
Jamie Wells is a 27 y.o. female presents to office today for annual physical exam examination.  Concerns today include:  1.No issues   Last dental exam: Started a new job,waiting for insurance to kick in  Last pap smear: Normal, 2014 Immunizations needed: None Refills needed today: None   Women's Health  Periods: Regular  Contraception: Previously using Sprintec, now just using Condoms  Pelvic symptoms: None  Sexual activity: yes  STD Screening: would like that today  Exercise: 3 times a week  Diet: Breakfast: bowel of special K + smoothie, Lunch: Chicken/Fish  Dinner Chicken/Fish  Smoking: None  Alcohol: Rarely - socially  Drugs: None  Mood: None     No past medical history on file. Social History   Socioeconomic History  . Marital status: Single    Spouse name: Not on file  . Number of children: Not on file  . Years of education: Not on file  . Highest education level: Not on file  Occupational History  . Not on file  Social Needs  . Financial resource strain: Not on file  . Food insecurity:    Worry: Not on file    Inability: Not on file  . Transportation needs:    Medical: Not on file    Non-medical: Not on file  Tobacco Use  . Smoking status: Never Smoker  . Smokeless tobacco: Never Used  Substance and Sexual Activity  . Alcohol use: Yes    Comment: occ  . Drug use: No  . Sexual activity: Not on file  Lifestyle  . Physical activity:    Days per week: Not on file    Minutes per session: Not on file  . Stress: Not on file  Relationships  . Social connections:    Talks on phone: Not on file    Gets together: Not on file    Attends religious service: Not on file    Active member of club or organization: Not on file    Attends meetings of clubs or organizations: Not on file    Relationship status: Not on file  . Intimate partner violence:    Fear of current or ex partner: Not on file    Emotionally abused: Not on file    Physically abused:  Not on file    Forced sexual activity: Not on file  Other Topics Concern  . Not on file  Social History Narrative  . Not on file   No past surgical history on file. Family History  Problem Relation Age of Onset  . Healthy Mother     ROS: Review of Systems Patient reports no  vision/ hearing changes,anorexia, weight change, fever ,adenopathy, persistant / recurrent hoarseness, swallowing issues, chest pain, edema,persistant / recurrent cough, hemoptysis, dyspnea(rest, exertional, paroxysmal nocturnal), gastrointestinal  bleeding (melena, rectal bleeding), abdominal pain, excessive heart burn, GU symptoms(dysuria, hematuria, pyuria, voiding/incontinence  Issues) syncope, focal weakness, severe memory loss, concerning skin lesions, depression, anxiety, abnormal bruising/bleeding, major joint swelling, breast masses or abnormal vaginal bleeding.    Physical exam Physical Exam  Constitutional: She is oriented to person, place, and time. She appears well-developed and well-nourished.  HENT:  Head: Normocephalic and atraumatic.  Eyes: Pupils are equal, round, and reactive to light. EOM are normal.  Neck: Normal range of motion. Neck supple.  Cardiovascular: Normal rate and regular rhythm.  Pulmonary/Chest: Effort normal and breath sounds normal.  Abdominal: Soft. Bowel sounds are normal.  Genitourinary: Vagina normal.  Neurological: She is alert and  oriented to person, place, and time.  Skin: Skin is warm. Capillary refill takes less than 2 seconds.  Psychiatric: She has a normal mood and affect.     Assessment/ Plan: Patient here for annual physical exam.   Screening for malignant neoplasm of cervix Pap smear + STD screening  - Cytology - PAP([place]) - with G/C - POCT Wet Prep ([place]) - HIV antibody - RPR - Currently using condoms regularly, discussed birth control alternatives + hand out given  - Follow up in 1 year     Margrete Delude PGY-3, [facility]

## 2018-04-11 LAB — HIV ANTIBODY (ROUTINE TESTING W REFLEX): HIV Screen 4th Generation wRfx: NONREACTIVE

## 2018-04-11 LAB — RPR: RPR Ser Ql: NONREACTIVE

## 2018-04-14 LAB — CYTOLOGY - PAP
CHLAMYDIA, DNA PROBE: POSITIVE — AB
DIAGNOSIS: NEGATIVE
NEISSERIA GONORRHEA: NEGATIVE

## 2018-04-15 ENCOUNTER — Telehealth: Payer: Self-pay | Admitting: Internal Medicine

## 2018-04-15 NOTE — Telephone Encounter (Signed)
Called patient to let her know she is positive for Chlamydia. She is currently traveling to [place] this week so she will give the clinic a call back with a pharmacy to send the medication to.

## 2018-04-15 NOTE — Telephone Encounter (Addendum)
Patient would [facility] prescription [facility] [place] at [address]  Unable to locate pharmacy in [place] but phone number is [phone].  Call back is [phone]  Ples SpecterAlisa Brake, RN [facility] RN)

## 2018-04-16 ENCOUNTER — Telehealth: Payer: Self-pay

## 2018-04-16 MED ORDER — AZITHROMYCIN 500 MG PO TABS: 1000.0000 mg | ORAL_TABLET | Freq: Every day | ORAL | 0 refills | Status: DC

## 2018-04-16 NOTE — Telephone Encounter (Signed)
Pt informed. Jamie Wells, CMA  

## 2018-04-16 NOTE — Telephone Encounter (Signed)
Sent in prescription. Please let patient know. Thanks.

## 2018-04-16 NOTE — Addendum Note (Signed)
Addended by: Noralee CharsMIKELL, Lauri Purdum Z on: 04/16/2018 11:21 AM   Modules accepted: Orders

## 2018-04-16 NOTE — Telephone Encounter (Signed)
As noted previously, treatment sent in.

## 2018-04-16 NOTE — Telephone Encounter (Signed)
Pt returned Dr. Roosevelt LocksMikells phone call.  Pharmacy info is as follows: [facility] [address] [place] [phone].

## 2018-07-29 ENCOUNTER — Ambulatory Visit (INDEPENDENT_AMBULATORY_CARE_PROVIDER_SITE_OTHER): Payer: 59

## 2018-07-29 VITALS — BP 122/70 | HR 70 | Ht 63.0 in | Wt 130.0 lb

## 2018-07-29 DIAGNOSIS — Z113 Encounter for screening for infections with a predominantly sexual mode of transmission: Secondary | ICD-10-CM | POA: Diagnosis not present

## 2018-07-29 DIAGNOSIS — N898 Other specified noninflammatory disorders of vagina: Secondary | ICD-10-CM

## 2018-07-29 NOTE — Progress Notes (Signed)
Pt recently got a new partner just here for std screening. No concerns about health. No itching or burning. Instructed Pt on how to do a self swab, pt completed and asked about HIV testing also, sent pt to lab to have completed. Signed pt for my chart to view results when resulted.pt had no questions.

## 2018-07-30 LAB — HIV ANTIBODY (ROUTINE TESTING W REFLEX): HIV SCREEN 4TH GENERATION: NONREACTIVE

## 2018-07-31 LAB — CERVICOVAGINAL ANCILLARY ONLY
Bacterial vaginitis: POSITIVE — AB
CHLAMYDIA, DNA PROBE: NEGATIVE
Candida vaginitis: NEGATIVE
Neisseria Gonorrhea: NEGATIVE
TRICH (WINDOWPATH): NEGATIVE

## 2018-10-01 ENCOUNTER — Encounter (HOSPITAL_COMMUNITY): Payer: Self-pay | Admitting: Emergency Medicine

## 2018-10-01 ENCOUNTER — Ambulatory Visit (HOSPITAL_COMMUNITY)
Admission: EM | Admit: 2018-10-01 | Discharge: 2018-10-01 | Disposition: A | Discharge status: Home / Self Care | Payer: 59 | Attending: Emergency Medicine | Admitting: Emergency Medicine

## 2018-10-01 DIAGNOSIS — K219 Gastro-esophageal reflux disease without esophagitis: Secondary | ICD-10-CM

## 2018-10-01 MED ORDER — ALUM & MAG HYDROXIDE-SIMETH 200-200-20 MG/5ML PO SUSP
ORAL | Status: AC
  Filled 2018-10-01: qty 30

## 2018-10-01 MED ORDER — LIDOCAINE VISCOUS HCL 2 % MT SOLN
15.0000 mL | Freq: Once | OROMUCOSAL | Status: AC
  Administered 2018-10-01: 15 mL via ORAL

## 2018-10-01 MED ORDER — ALUM & MAG HYDROXIDE-SIMETH 200-200-20 MG/5ML PO SUSP
30.0000 mL | Freq: Once | ORAL | Status: AC
  Administered 2018-10-01: 30 mL via ORAL

## 2018-10-01 MED ORDER — OMEPRAZOLE 20 MG PO CPDR: 20.0000 mg | DELAYED_RELEASE_CAPSULE | Freq: Every day | ORAL | 0 refills | Status: DC

## 2018-10-01 MED ORDER — LIDOCAINE VISCOUS HCL 2 % MT SOLN
OROMUCOSAL | Status: AC
  Filled 2018-10-01: qty 15

## 2018-10-01 NOTE — ED Triage Notes (Signed)
Pt c/o upper abdominal sharp pains and threw up in themiddle of the night, pt also c/o sharp chest pains that go away. States it happens after she eats somethings.

## 2018-10-01 NOTE — ED Provider Notes (Signed)
[facility]    CSN: [id number] Arrival date & time: 10/01/18  0930     History   Chief Complaint Chief Complaint  Patient presents with  . Abdominal Pain    HPI Jamie Wells N Hornbeck is a 27 y.o. female.   Jamie Wells presents with complaints of upper abdominal pain, which is worse at night. She woke last night with upper abdominal pain and some nausea. Forced herself to vomit which did help with symptoms. States she does tend to wake with chest pain in the night and feels like she can't breathe. Worse after eating. Last night ate BBQ for dinner prior to onset of symptoms. Normal BM's. Epigastric pain 6/10, still with slight nausea. No palpitations or shortness of breath . No arm pain. Took pepto bismol which helped some with symptoms. Has had similar in the past, was told had gerd and used omeprazole. This helped. No longer has been taking however.    ROS per HPI.      History reviewed. No pertinent past medical history.  Patient Active Problem List   Diagnosis Date Noted  . Screening for malignant neoplasm of cervix 04/10/2018  . Flank pain 09/12/2017  . Encounter for surveillance of contraceptive pills 09/12/2017  . ECZEMA, ATOPIC DERMATITIS 01/08/2007    History reviewed. No pertinent surgical history.  OB History   None      Home Medications    Prior to Admission medications   Medication Sig Start Date End Date Taking? Authorizing Provider  omeprazole (PRILOSEC) 20 MG capsule Take 1 capsule (20 mg total) by mouth daily. 10/01/18   Georgetta HaberBurky, Natalie B, NP    Family History Family History  Problem Relation Age of Onset  . Healthy Mother     Social History Social History   Tobacco Use  . Smoking status: Never Smoker  . Smokeless tobacco: Never Used  Substance Use Topics  . Alcohol use: Yes    Comment: occ  . Drug use: No     Allergies   Patient has no known allergies.   Review of Systems Review of Systems   Physical Exam Triage  Vital Signs ED Triage Vitals  Enc Vitals Group     BP 10/01/18 1025 101/65     Pulse Rate 10/01/18 1025 77     Resp 10/01/18 1025 16     Temp 10/01/18 1025 98.4 F (36.9 C)     Temp src --      SpO2 10/01/18 1025 100 %     Weight --      Height --      Head Circumference --      Peak Flow --      Pain Score 10/01/18 1026 0     Pain Loc --      Pain Edu? --      Excl. in GC? --    No data found.  Updated Vital Signs BP 101/65   Pulse 77   Temp 98.4 F (36.9 C)   Resp 16   LMP 09/17/2018   SpO2 100%    Physical Exam  Constitutional: She is oriented to person, place, and time. She appears well-developed and well-nourished. No distress.  Cardiovascular: Normal rate, regular rhythm and normal heart sounds.  Pulmonary/Chest: Effort normal and breath sounds normal.  Abdominal: Soft. Bowel sounds are normal. There is tenderness in the epigastric area.  Neurological: She is alert and oriented to person, place, and time.  Skin: Skin is warm and dry.  UC Treatments / Results  Labs (all labs ordered are listed, but only abnormal results are displayed) Labs Reviewed - No data to display  EKG None  Radiology No results found.  Procedures Procedures (including critical care time)  Medications Ordered in UC Medications  alum & mag hydroxide-simeth (MAALOX/MYLANTA) 200-200-20 MG/5ML suspension 30 mL (has no administration in time range)    And  lidocaine (XYLOCAINE) 2 % viscous mouth solution 15 mL (has no administration in time range)    Initial Impression / Assessment and Plan / UC Course  I have reviewed the triage vital signs and the nursing notes.  Pertinent labs & imaging results that were available during my care of the patient were reviewed by me and considered in my medical decision making (see chart for details).     Non toxic, afebrile. No red flag abdominal findings. History and physical consistent with gerd today. Gi cocktail provided, refill of  omeprazole provided. Encouraged continued follow up with PCP. Return precautions provided. Patient verbalized understanding and agreeable to plan.    Final Clinical Impressions(s) / UC Diagnoses   Final diagnoses:  Gastroesophageal reflux disease, esophagitis presence not specified     Discharge Instructions     Please restart omeprazole daily.  See provided information about food choices for GERD.  Try to eat dinner at least 2-3 hours prior to going to sleep at night, elevating the head of your bed slightly may also be helpful. Continue to follow up with your primary care provider if no improvement or persistent symptoms.    ED Prescriptions    Medication Sig Dispense Auth. Provider   omeprazole (PRILOSEC) 20 MG capsule Take 1 capsule (20 mg total) by mouth daily. 30 capsule Georgetta Haber, NP     Controlled Substance Prescriptions  Controlled Substance Registry consulted? Not Applicable   Georgetta Haber, NP 10/01/18 1046

## 2018-10-01 NOTE — Discharge Instructions (Addendum)
Please restart omeprazole daily.  See provided information about food choices for GERD.  Try to eat dinner at least 2-3 hours prior to going to sleep at night, elevating the head of your bed slightly may also be helpful. Continue to follow up with your primary care provider if no improvement or persistent symptoms.
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
## 2018-10-05 ENCOUNTER — Emergency Department (HOSPITAL_COMMUNITY)
Admission: EM | Admit: 2018-10-05 | Discharge: 2018-10-05 | Disposition: A | Discharge status: Home / Self Care | Payer: 59 | Attending: Emergency Medicine | Admitting: Emergency Medicine

## 2018-10-05 ENCOUNTER — Other Ambulatory Visit: Payer: Self-pay

## 2018-10-05 DIAGNOSIS — R109 Unspecified abdominal pain: Secondary | ICD-10-CM | POA: Insufficient documentation

## 2018-10-05 DIAGNOSIS — R112 Nausea with vomiting, unspecified: Secondary | ICD-10-CM | POA: Insufficient documentation

## 2018-10-05 DIAGNOSIS — R197 Diarrhea, unspecified: Secondary | ICD-10-CM | POA: Insufficient documentation

## 2018-10-05 LAB — I-STAT BETA HCG BLOOD, ED (MC, WL, AP ONLY)

## 2018-10-05 LAB — CBC WITH DIFFERENTIAL/PLATELET
Abs Immature Granulocytes: 0.02 10*3/uL (ref 0.00–0.07)
BASOS ABS: 0 10*3/uL (ref 0.0–0.1)
BASOS PCT: 0 %
EOS ABS: 0 10*3/uL (ref 0.0–0.5)
EOS PCT: 0 %
HCT: 31.8 % — ABNORMAL LOW (ref 36.0–46.0)
Hemoglobin: 9.5 g/dL — ABNORMAL LOW (ref 12.0–15.0)
Immature Granulocytes: 0 %
Lymphocytes Relative: 17 %
Lymphs Abs: 1.3 10*3/uL (ref 0.7–4.0)
MCH: 24.9 pg — AB (ref 26.0–34.0)
MCHC: 29.9 g/dL — AB (ref 30.0–36.0)
MCV: 83.5 fL (ref 80.0–100.0)
MONO ABS: 0.4 10*3/uL (ref 0.1–1.0)
Monocytes Relative: 6 %
NRBC: 0 % (ref 0.0–0.2)
Neutro Abs: 5.9 10*3/uL (ref 1.7–7.7)
Neutrophils Relative %: 77 %
PLATELETS: 434 10*3/uL — AB (ref 150–400)
RBC: 3.81 MIL/uL — AB (ref 3.87–5.11)
RDW: 17.1 % — AB (ref 11.5–15.5)
WBC: 7.7 10*3/uL (ref 4.0–10.5)

## 2018-10-05 LAB — COMPREHENSIVE METABOLIC PANEL
ALK PHOS: 52 U/L (ref 38–126)
ALT: 16 U/L (ref 0–44)
ANION GAP: 7 (ref 5–15)
AST: 36 U/L (ref 15–41)
Albumin: 3.9 g/dL (ref 3.5–5.0)
BUN: <5 mg/dL — ABNORMAL LOW (ref 6–20)
CHLORIDE: 105 mmol/L (ref 98–111)
CO2: 25 mmol/L (ref 22–32)
Calcium: 8.6 mg/dL — ABNORMAL LOW (ref 8.9–10.3)
Creatinine, Ser: 0.8 mg/dL (ref 0.44–1.00)
GFR calc non Af Amer: >60 mL/min (ref >60)
Glucose, Bld: 100 mg/dL — ABNORMAL HIGH (ref 70–99)
POTASSIUM: 3.3 mmol/L — AB (ref 3.5–5.1)
SODIUM: 137 mmol/L (ref 135–145)
Total Bilirubin: 0.4 mg/dL (ref 0.3–1.2)
Total Protein: 7.8 g/dL (ref 6.5–8.1)

## 2018-10-05 LAB — LIPASE, BLOOD: Lipase: 42 U/L (ref 11–51)

## 2018-10-05 MED ORDER — ONDANSETRON HCL 4 MG/2ML IJ SOLN
4.0000 mg | Freq: Once | INTRAMUSCULAR | Status: AC
  Administered 2018-10-05: 4 mg via INTRAVENOUS
  Filled 2018-10-05: qty 2

## 2018-10-05 MED ORDER — ONDANSETRON 4 MG PO TBDP: 4.0000 mg | ORAL_TABLET | Freq: Three times a day (TID) | ORAL | 0 refills | Status: DC | PRN

## 2018-10-05 MED ORDER — SODIUM CHLORIDE 0.9 % IV BOLUS
1000.0000 mL | Freq: Once | INTRAVENOUS | Status: AC
  Administered 2018-10-05: 1000 mL via INTRAVENOUS

## 2018-10-05 MED ORDER — DICYCLOMINE HCL 20 MG PO TABS: 20.0000 mg | ORAL_TABLET | Freq: Two times a day (BID) | ORAL | 0 refills | Status: DC

## 2018-10-05 NOTE — ED Triage Notes (Signed)
Patient c/o n/v/d since Thursday; seen at Urgent Care for same.

## 2018-10-05 NOTE — Discharge Instructions (Signed)
Take the prescribed medication as directed.  Rest and drink lots of fluids.  Gentle diet for now, progress back to normal as tolerated. Follow-up with your primary care doctor. Return to the ED for new or worsening symptoms.

## 2018-10-05 NOTE — ED Provider Notes (Signed)
[facility] EMERGENCY DEPARTMENT Provider Note   CSN: [id number] Arrival date & time: 10/05/18  0331     History   Chief Complaint Chief Complaint  Patient presents with  . N/V/D    HPI Jamie Wells is a 27 y.o. female.  The history is provided by the patient and medical records.     27 year old female with history of eczema and GERD, presenting to the ED with nausea, vomiting, and diarrhea.  States she started experiencing some epigastric pain last Thursday, 10/01/2018.  She was seen at urgent care and given a GI cocktail prescribed some problems that was suspected GERD/gastritis.  States overall she started feeling better and then yesterday she developed nausea, vomiting, and diarrhea.  She does report some generalized abdominal cramping.  Emesis and stool has been nonbloody.  She has not been able to eat or drink anything, whenever she tries it comes right back up.  She does report some sick contacts at work with similar.  She reports subjective fever but is afebrile here.  She has not tried any medications prior to arrival.  No past medical history on file.  Patient Active Problem List   Diagnosis Date Noted  . Screening for malignant neoplasm of cervix 04/10/2018  . Flank pain 09/12/2017  . Encounter for surveillance of contraceptive pills 09/12/2017  . ECZEMA, ATOPIC DERMATITIS 01/08/2007    No past surgical history on file.   OB History   None      Home Medications    Prior to Admission medications   Medication Sig Start Date End Date Taking? Authorizing Provider  omeprazole (PRILOSEC) 20 MG capsule Take 1 capsule (20 mg total) by mouth daily. 10/01/18   Georgetta Haber, NP    Family History Family History  Problem Relation Age of Onset  . Healthy Mother     Social History Social History   Tobacco Use  . Smoking status: Never Smoker  . Smokeless tobacco: Never Used  Substance Use Topics  . Alcohol use: Yes    Comment: occ    . Drug use: No     Allergies   Patient has no known allergies.   Review of Systems Review of Systems  Gastrointestinal: Positive for diarrhea, nausea and vomiting.  All other systems reviewed and are negative.    Physical Exam Updated Vital Signs LMP 09/17/2018   Physical Exam  Constitutional: She is oriented to person, place, and time. She appears well-developed and well-nourished.  HENT:  Head: Normocephalic and atraumatic.  Mouth/Throat: Oropharynx is clear and moist.  Mildly dry mucous membranes  Eyes: Pupils are equal, round, and reactive to light. Conjunctivae and EOM are normal.  Neck: Normal range of motion.  Cardiovascular: Normal rate, regular rhythm and normal heart sounds.  Pulmonary/Chest: Effort normal and breath sounds normal. No stridor. No respiratory distress.  Abdominal: Soft. Bowel sounds are normal. There is no tenderness. There is no rigidity and no guarding.  Soft, nontender  Musculoskeletal: Normal range of motion.  Neurological: She is alert and oriented to person, place, and time.  Skin: Skin is warm and dry.  Psychiatric: She has a normal mood and affect.  Nursing note and vitals reviewed.    ED Treatments / Results  Labs (all labs ordered are listed, but only abnormal results are displayed) Labs Reviewed  CBC WITH DIFFERENTIAL/PLATELET - Abnormal; Notable for the following components:      Result Value   RBC 3.81 (*)    Hemoglobin  9.5 (*)    HCT 31.8 (*)    MCH 24.9 (*)    MCHC 29.9 (*)    RDW 17.1 (*)    Platelets 434 (*)    All other components within normal limits  COMPREHENSIVE METABOLIC PANEL - Abnormal; Notable for the following components:   Potassium 3.3 (*)    Glucose, Bld 100 (*)    BUN <5 (*)    Calcium 8.6 (*)    All other components within normal limits  LIPASE, BLOOD  I-STAT BETA HCG BLOOD, ED ([facility], [facility], [facility] ONLY)    EKG None  Radiology No results found.  Procedures Procedures (including critical care  time)  Medications Ordered in ED Medications  sodium chloride 0.9 % bolus 1,000 mL (0 mLs Intravenous Stopped 10/05/18 0544)  ondansetron (ZOFRAN) injection 4 mg (4 mg Intravenous Given 10/05/18 0352)     Initial Impression / Assessment and Plan / ED Course  I have reviewed the triage vital signs and the nursing notes.  Pertinent labs & imaging results that were available during my care of the patient were reviewed by me and considered in my medical decision making (see chart for details).  27 year old female here with nausea, vomiting, and diarrhea since yesterday.  She is afebrile and nontoxic in appearance here.  Reports some abdominal cramping but abdomen is soft and benign.  Does have mildly dry mucous membranes and reports poor oral intake today.  Plan for screening labs, IV fluids, Zofran.  Will reassess.  Screening labs overall reassuring.  Patient has responded well to IV fluids and Zofran.  She has not had any active emesis here.  Will p.o. Challenge.  Patient tolerating spite without issues.  No active emesis while in the ED.  Resting comfortably at this time.  Feel she is stable for discharge.  Suspect viral etiology.  Plan to d/c home with symptomatic care.  Close follow-up with PCP.  She can return here for any new/acute changes.  Final Clinical Impressions(s) / ED Diagnoses   Final diagnoses:  Nausea vomiting and diarrhea    ED Discharge Orders         Ordered    ondansetron (ZOFRAN ODT) 4 MG disintegrating tablet  Every 8 hours PRN     10/05/18 0543    dicyclomine (BENTYL) 20 MG tablet  2 times daily     10/05/18 0543           Garlon HatchetSanders, Harsha Yusko M, PA-C 10/05/18 [phone]    Zadie RhineWickline, Donald, MD 10/05/18 [phone]

## 2019-04-17 ENCOUNTER — Other Ambulatory Visit: Payer: Self-pay

## 2019-04-17 ENCOUNTER — Ambulatory Visit (HOSPITAL_COMMUNITY)
Admission: EM | Admit: 2019-04-17 | Discharge: 2019-04-17 | Disposition: A | Discharge status: Home / Self Care | Payer: 59 | Attending: Family Medicine | Admitting: Family Medicine

## 2019-04-17 DIAGNOSIS — R1013 Epigastric pain: Secondary | ICD-10-CM | POA: Diagnosis not present

## 2019-04-17 MED ORDER — OMEPRAZOLE 20 MG PO CPDR: 20.0000 mg | DELAYED_RELEASE_CAPSULE | Freq: Every day | ORAL | 0 refills | Status: DC

## 2019-04-17 NOTE — ED Triage Notes (Signed)
Pt acid reflux flared up last night. Pt states she has a burning in her chest that felt like acid reflux.

## 2019-04-17 NOTE — ED Provider Notes (Signed)
[facility]   [id number] 04/17/19 Arrival Time: 1121  ASSESSMENT & PLAN:  1. Dyspepsia    Benign abdominal exam. No indications for urgent abdominal/pelvic imaging at this time. Discussed.  Meds ordered this encounter  Medications  . omeprazole (PRILOSEC) 20 MG capsule    Sig: Take 1 capsule (20 mg total) by mouth daily.    Dispense:  30 capsule    Refill:  0    Follow-up Information    Howard PouchFeng, Lauren, MD.   Specialty:  Family Medicine Why:  As needed. Contact information: [address] [place] [address] [phone]          Adhere to simple, bland diet. Initiate empiric trial of acid suppression; see orders.  Reviewed expectations re: course of current medical issues. Questions answered. Outlined signs and symptoms indicating need for more acute intervention. Patient verbalized understanding. After Visit Summary given.   SUBJECTIVE: History from: patient. Jamie MustardBrittany N Schnetzer is a 28 y.o. female who presents with complaint of intermittent epigastric abdominal discomfort. Onset gradual, yesterday evening. Discomfort described as burning; without radiation. Symptoms are on/off since beginning. Fever: absent. Aggravating factors: eating. Alleviating factors: none identified. Associated symptoms: belching. She denies constipation, diarrhea, fever, myalgias, nausea, sweats and vomiting. Appetite: normal. PO intake: normal. Ambulatory without assistance. Urinary symptoms: none. Bowel movements: have not significantly changed; last bowel movement within the past couple of days and without blood. History of similar: "long time ago"; GERD. OTC treatment: none.  Patient's last menstrual period was 04/10/2019. No past surgical history on file.  ROS: As per HPI. All other systems negative.  OBJECTIVE:  Vitals:   04/17/19 1213 04/17/19 1214  BP: 112/77   Pulse: 76   Resp: 16   Temp: 98.1 F (36.7 C)   TempSrc: Oral   SpO2: 100%   Weight:  56.7 kg     General appearance: alert, oriented, no acute distress Lungs: clear to auscultation bilaterally; unlabored respirations Heart: regular rate and rhythm Abdomen: soft; without distention; no tenderness; normal bowel sounds; without masses or organomegaly; without guarding or rebound tenderness Back: without CVA tenderness; FROM at waist Extremities: without LE edema; symmetrical; without gross deformities Skin: warm and dry Neurologic: normal gait Psychological: alert and cooperative; normal mood and affect   No Known Allergies                                              Social History   Socioeconomic History  . Marital status: Single    Spouse name: Not on file  . Number of children: Not on file  . Years of education: Not on file  . Highest education level: Not on file  Occupational History  . Not on file  Social Needs  . Financial resource strain: Not on file  . Food insecurity:    Worry: Not on file    Inability: Not on file  . Transportation needs:    Medical: Not on file    Non-medical: Not on file  Tobacco Use  . Smoking status: Never Smoker  . Smokeless tobacco: Never Used  Substance and Sexual Activity  . Alcohol use: Yes    Comment: occ  . Drug use: No  . Sexual activity: Not on file  Lifestyle  . Physical activity:    Days per week: Not on file    Minutes per session: Not  on file  . Stress: Not on file  Relationships  . Social connections:    Talks on phone: Not on file    Gets together: Not on file    Attends religious service: Not on file    Active member of club or organization: Not on file    Attends meetings of clubs or organizations: Not on file    Relationship status: Not on file  . Intimate partner violence:    Fear of current or ex partner: Not on file    Emotionally abused: Not on file    Physically abused: Not on file    Forced sexual activity: Not on file  Other Topics Concern  . Not on file  Social History Narrative  . Not on file    Family History  Problem Relation Age of Onset  . Healthy Mother      Vanessa Kick, MD 04/21/19 [phone]

## 2019-10-01 ENCOUNTER — Encounter (HOSPITAL_COMMUNITY): Payer: Self-pay

## 2019-10-01 ENCOUNTER — Ambulatory Visit (HOSPITAL_COMMUNITY)
Admission: EM | Admit: 2019-10-01 | Discharge: 2019-10-01 | Disposition: A | Discharge status: Home / Self Care | Payer: 59 | Attending: Family Medicine | Admitting: Family Medicine

## 2019-10-01 ENCOUNTER — Other Ambulatory Visit: Payer: Self-pay

## 2019-10-01 DIAGNOSIS — M546 Pain in thoracic spine: Secondary | ICD-10-CM

## 2019-10-01 MED ORDER — KETOROLAC TROMETHAMINE 30 MG/ML IJ SOLN
30.0000 mg | Freq: Once | INTRAMUSCULAR | Status: AC
  Administered 2019-10-01: 09:00:00 30 mg via INTRAMUSCULAR

## 2019-10-01 MED ORDER — CYCLOBENZAPRINE HCL 10 MG PO TABS: 5.0000 mg | ORAL_TABLET | Freq: Every day | ORAL | 0 refills | Status: DC

## 2019-10-01 MED ORDER — KETOROLAC TROMETHAMINE 30 MG/ML IJ SOLN
INTRAMUSCULAR | Status: AC
  Filled 2019-10-01: qty 1

## 2019-10-01 NOTE — ED Triage Notes (Signed)
Pt presents with left shoulder pain since yesterday; pt states she works at [employer] and lifts heavy items day to day.

## 2019-10-01 NOTE — ED Provider Notes (Signed)
[facility]    CSN: [id number] Arrival date & time: 10/01/19  3762      History   Chief Complaint Chief Complaint  Patient presents with  . Shoulder Pain    HPI Jamie Wells is a 28 y.o. female.   Patient is a 54-year female presents today with left upper back pain.  Symptoms been constant, waxing waning since yesterday.  Does do some heavy lifting at work.  Denies any specific injuries.  Pain is described as tightness.  She took ibuprofen with some relief.  Although she was unable to sleep last night due to discomfort.  No numbness, tingling, weakness, fevers.  ROS per HPI      History reviewed. No pertinent past medical history.  Patient Active Problem List   Diagnosis Date Noted  . Screening for malignant neoplasm of cervix 04/10/2018  . Flank pain 09/12/2017  . Encounter for surveillance of contraceptive pills 09/12/2017  . ECZEMA, ATOPIC DERMATITIS 01/08/2007    History reviewed. No pertinent surgical history.  OB History   No obstetric history on file.      Home Medications    Prior to Admission medications   Medication Sig Start Date End Date Taking? Authorizing Provider  cyclobenzaprine (FLEXERIL) 10 MG tablet Take 0.5 tablets (5 mg total) by mouth at bedtime. 10/01/19   Loura Halt A, NP  dicyclomine (BENTYL) 20 MG tablet Take 1 tablet (20 mg total) by mouth 2 (two) times daily. 10/05/18   Larene Pickett, PA-C  omeprazole (PRILOSEC) 20 MG capsule Take 1 capsule (20 mg total) by mouth daily. 04/17/19   Vanessa Kick, MD  ondansetron (ZOFRAN ODT) 4 MG disintegrating tablet Take 1 tablet (4 mg total) by mouth every 8 (eight) hours as needed for nausea. 10/05/18   Larene Pickett, PA-C    Family History Family History  Problem Relation Age of Onset  . Healthy Mother     Social History Social History   Tobacco Use  . Smoking status: Never Smoker  . Smokeless tobacco: Never Used  Substance Use Topics  . Alcohol use: Yes   Comment: occ  . Drug use: No     Allergies   Patient has no known allergies.   Review of Systems Review of Systems   Physical Exam Triage Vital Signs ED Triage Vitals  Enc Vitals Group     BP 10/01/19 0848 128/88     Pulse Rate 10/01/19 0848 90     Resp 10/01/19 0848 16     Temp 10/01/19 0848 99.2 F (37.3 C)     Temp Source 10/01/19 0848 Oral     SpO2 10/01/19 0848 99 %     Weight --      Height --      Head Circumference --      Peak Flow --      Pain Score 10/01/19 0852 7     Pain Loc --      Pain Edu? --      Excl. in [place]? --    No data found.  Updated Vital Signs BP 128/88 (BP Location: Left Arm)   Pulse 90   Temp 99.2 F (37.3 C) (Oral)   Resp 16   LMP 09/22/2019   SpO2 99%   Visual Acuity Right Eye Distance:   Left Eye Distance:   Bilateral Distance:    Right Eye Near:   Left Eye Near:    Bilateral Near:     Physical Exam  Vitals signs and nursing note reviewed.  Constitutional:      General: She is not in acute distress.    Appearance: Normal appearance. She is not ill-appearing, toxic-appearing or diaphoretic.  HENT:     Head: Normocephalic and atraumatic.     Nose: Nose normal.  Eyes:     Conjunctiva/sclera: Conjunctivae normal.  Neck:     Musculoskeletal: Normal range of motion.  Pulmonary:     Effort: Pulmonary effort is normal.  Musculoskeletal: Normal range of motion.     Thoracic back: She exhibits tenderness and spasm.       Back:       Arms:  Skin:    General: Skin is warm and dry.     Findings: No rash.  Neurological:     Mental Status: She is alert.  Psychiatric:        Mood and Affect: Mood normal.      UC Treatments / Results  Labs (all labs ordered are listed, but only abnormal results are displayed) Labs Reviewed - No data to display  EKG   Radiology No results found.  Procedures Procedures (including critical care time)  Medications Ordered in UC Medications  ketorolac (TORADOL) 30 MG/ML  injection 30 mg (has no administration in time range)    Initial Impression / Assessment and Plan / UC Course  I have reviewed the triage vital signs and the nursing notes.  Pertinent labs & imaging results that were available during my care of the patient were reviewed by me and considered in my medical decision making (see chart for details).     Muscle spasm to left upper back area-treating pain with Toradol here in clinic.  Sending low-dose Flexeril to use as bedtime as needed. No shoulder pain with good ROM.  Recommended heat, gentle stretching and massage. Follow up as needed for continued or worsening symptoms  Final Clinical Impressions(s) / UC Diagnoses   Final diagnoses:  Acute left-sided thoracic back pain     Discharge Instructions     I believe that you have a muscle spasm in your back. The best treatment for this is to apply heat to the area, gentle stretching and massage. I will give you a low-dose muscle relaxant to take at bedtime as needed.   Toradol given here for pain. Follow up as needed for continued or worsening symptoms     ED Prescriptions    Medication Sig Dispense Auth. Provider   cyclobenzaprine (FLEXERIL) 10 MG tablet Take 0.5 tablets (5 mg total) by mouth at bedtime. 20 tablet Dahlia Byes A, NP     PDMP not reviewed this encounter.   Janace Aris, NP 10/01/19 [phone]

## 2019-10-01 NOTE — Discharge Instructions (Signed)
I believe that you have a muscle spasm in your back. The best treatment for this is to apply heat to the area, gentle stretching and massage. I will give you a low-dose muscle relaxant to take at bedtime as needed.   Toradol given here for pain. Follow up as needed for continued or worsening symptoms

## 2019-10-27 ENCOUNTER — Other Ambulatory Visit: Payer: 59

## 2019-11-03 ENCOUNTER — Encounter: Payer: Self-pay | Admitting: Family Medicine

## 2019-11-06 MED ORDER — FLUCONAZOLE 150 MG PO TABS: 150.0000 mg | ORAL_TABLET | Freq: Once | ORAL | 0 refills | Status: AC

## 2019-11-06 NOTE — Telephone Encounter (Signed)
Called patient to ask about symptoms. Patient reports itching around vaginal area and thick white discharge. She does not have any dysuria, fevers, chills, pain. Will treat empirically with diflucan. If no improvement in 72 hours, will fit patient in to clinic and do further work up.   Wilber Oliphant, M.D.  4:06 PM 11/06/2019

## 2020-02-04 ENCOUNTER — Encounter: Payer: Self-pay | Admitting: Family Medicine

## 2020-11-23 NOTE — Progress Notes (Signed)
    SUBJECTIVE:   CHIEF COMPLAINT / HPI:   Left sided neck pain: Started 2 days ago.  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  No radiating.  Has had muscle spasm of the neck in the past.  Flexeril worked for her previously.  Has taken ibuprofen for this incident.    Eczema: Patient uses hyrocortisone as needed.  Does not use emollients.  She feels like her eczema is mild  Flu vax -patient getting flu vaccine today.  Had COVID booster in December.  Marland Kitchen   PERTINENT  PMH / PSH:   OBJECTIVE:   BP 100/64   Pulse 82   Ht 5\' 3"  (1.6 m)   Wt 137 lb 3.2 oz (62.2 kg)   LMP 11/19/2020 (Exact Date)   SpO2 99%   BMI 24.30 kg/m   General: Alert and oriented.  No acute distress CV: Regular rate and rhythm MSK: Decreased range of motion with lateral rotation.  Tenderness to palpation over the left trapezius above the scapula. Skin: No rashes  ASSESSMENT/PLAN:   Trapezius muscle spasm Has occurred before.  Symptoms improved with muscle relaxant.  Will prescribe short course of Flexeril today.  Advised patient to use heat therapy and wrote patient note recommending light duty for the next week.  Eczema Mild.  Resolves with occasional hydrocortisone use for flares.  Recommended patient use emollients as well.  Anemia Previous lab work showed anemia and hypokalemia in 2019.  We will repeat CBC and BMP.  Will do further work-up if still anemic     2020, MD [facility]

## 2020-11-24 ENCOUNTER — Ambulatory Visit: Payer: 59 | Admitting: Family Medicine

## 2020-11-24 ENCOUNTER — Other Ambulatory Visit: Payer: Self-pay

## 2020-11-24 ENCOUNTER — Encounter: Payer: Self-pay | Admitting: Family Medicine

## 2020-11-24 VITALS — BP 100/64 | HR 82 | Ht 63.0 in | Wt 137.2 lb

## 2020-11-24 DIAGNOSIS — D649 Anemia, unspecified: Secondary | ICD-10-CM | POA: Diagnosis not present

## 2020-11-24 DIAGNOSIS — E876 Hypokalemia: Secondary | ICD-10-CM

## 2020-11-24 DIAGNOSIS — L309 Dermatitis, unspecified: Secondary | ICD-10-CM

## 2020-11-24 DIAGNOSIS — M62838 Other muscle spasm: Secondary | ICD-10-CM | POA: Diagnosis not present

## 2020-11-24 DIAGNOSIS — Z23 Encounter for immunization: Secondary | ICD-10-CM

## 2020-11-24 MED ORDER — CYCLOBENZAPRINE HCL 10 MG PO TABS: 5.0000 mg | ORAL_TABLET | Freq: Every day | ORAL | 0 refills | Status: DC

## 2020-11-24 NOTE — Patient Instructions (Signed)
It was nice to meet you today,  I have prescribed you a muscle relaxer to use short-term for your pain in your neck.  You can also use Tylenol.  Can also use heat applied to the area as needed.  You should stretch your neck muscles gently multiple times during the day to help improve your symptoms.  You should avoid heavy lifting for the next week.  I will give you a note for work.  You can use over-the-counter emollient such as Eucerin, CeraVe, Vaseline as much as needed to help control eczema symptoms.  I have gotten some basic blood work today and I will call you when I get the results.  Have a great day,  Frederic Jericho, MD

## 2020-11-25 LAB — BASIC METABOLIC PANEL
BUN/Creatinine Ratio: 14 (ref 9–23)
BUN: 11 mg/dL (ref 6–20)
CO2: 24 mmol/L (ref 20–29)
Calcium: 9.6 mg/dL (ref 8.7–10.2)
Chloride: 103 mmol/L (ref 96–106)
Creatinine, Ser: 0.81 mg/dL (ref 0.57–1.00)
GFR calc Af Amer: 112 mL/min/{1.73_m2} (ref >59)
GFR calc non Af Amer: 97 mL/min/{1.73_m2} (ref >59)
Glucose: 76 mg/dL (ref 65–99)
Potassium: 4.2 mmol/L (ref 3.5–5.2)
Sodium: 140 mmol/L (ref 134–144)

## 2020-11-25 LAB — CBC
Hematocrit: 30.7 % — ABNORMAL LOW (ref 34.0–46.6)
Hemoglobin: 9.9 g/dL — ABNORMAL LOW (ref 11.1–15.9)
MCH: 28.1 pg (ref 26.6–33.0)
MCHC: 32.2 g/dL (ref 31.5–35.7)
MCV: 87 fL (ref 79–97)
Platelets: 372 10*3/uL (ref 150–450)
RBC: 3.52 x10E6/uL — ABNORMAL LOW (ref 3.77–5.28)
RDW: 17.7 % — ABNORMAL HIGH (ref 11.7–15.4)
WBC: 4.9 10*3/uL (ref 3.4–10.8)

## 2020-11-26 DIAGNOSIS — M62838 Other muscle spasm: Secondary | ICD-10-CM | POA: Insufficient documentation

## 2020-11-26 DIAGNOSIS — D649 Anemia, unspecified: Secondary | ICD-10-CM | POA: Insufficient documentation

## 2020-11-26 DIAGNOSIS — E876 Hypokalemia: Secondary | ICD-10-CM | POA: Insufficient documentation

## 2020-11-26 NOTE — Assessment & Plan Note (Signed)
Has occurred before.  Symptoms improved with muscle relaxant.  Will prescribe short course of Flexeril today.  Advised patient to use heat therapy and wrote patient note recommending light duty for the next week.

## 2020-11-26 NOTE — Assessment & Plan Note (Signed)
Mild.  Resolves with occasional hydrocortisone use for flares.  Recommended patient use emollients as well.

## 2020-11-26 NOTE — Assessment & Plan Note (Signed)
Previous lab work showed anemia and hypokalemia in 2019.  We will repeat CBC and BMP.  Will do further work-up if still anemic

## 2020-11-27 ENCOUNTER — Other Ambulatory Visit: Payer: Self-pay | Admitting: Family Medicine

## 2020-11-27 DIAGNOSIS — D649 Anemia, unspecified: Secondary | ICD-10-CM

## 2020-12-05 ENCOUNTER — Other Ambulatory Visit: Payer: Self-pay

## 2020-12-05 ENCOUNTER — Other Ambulatory Visit: Payer: 59

## 2020-12-06 LAB — ANEMIA PANEL
Ferritin: 7 ng/mL — ABNORMAL LOW (ref 15–150)
Folate, Hemolysate: 365 ng/mL
Folate, RBC: 1166 ng/mL (ref >498)
Hematocrit: 31.3 % — ABNORMAL LOW (ref 34.0–46.6)
Iron Saturation: 11 % — ABNORMAL LOW (ref 15–55)
Iron: 37 ug/dL (ref 27–159)
Retic Ct Pct: 1.2 % (ref 0.6–2.6)
Total Iron Binding Capacity: 351 ug/dL (ref 250–450)
UIBC: 314 ug/dL (ref 131–425)
Vitamin B-12: 588 pg/mL (ref 232–1245)

## 2020-12-12 ENCOUNTER — Other Ambulatory Visit: Payer: Self-pay | Admitting: Family Medicine

## 2020-12-12 MED ORDER — FERROUS SULFATE 325 (65 FE) MG PO TBEC: 325.0000 mg | DELAYED_RELEASE_TABLET | Freq: Every day | ORAL | 3 refills | Status: DC

## 2021-05-02 ENCOUNTER — Ambulatory Visit: Payer: 59 | Admitting: Family Medicine

## 2021-05-03 ENCOUNTER — Encounter: Payer: Self-pay | Admitting: Family Medicine

## 2021-05-03 ENCOUNTER — Ambulatory Visit: Payer: 59 | Admitting: Family Medicine

## 2021-05-03 ENCOUNTER — Other Ambulatory Visit: Payer: Self-pay

## 2021-05-03 ENCOUNTER — Other Ambulatory Visit (HOSPITAL_COMMUNITY)
Admission: RE | Admit: 2021-05-03 | Discharge: 2021-05-03 | Disposition: A | Discharge status: Home / Self Care | Payer: 59 | Source: Ambulatory Visit | Attending: Family Medicine | Admitting: Family Medicine

## 2021-05-03 VITALS — BP 108/68 | Ht 63.0 in | Wt 148.0 lb

## 2021-05-03 DIAGNOSIS — Z124 Encounter for screening for malignant neoplasm of cervix: Secondary | ICD-10-CM | POA: Insufficient documentation

## 2021-05-03 DIAGNOSIS — L309 Dermatitis, unspecified: Secondary | ICD-10-CM

## 2021-05-03 DIAGNOSIS — Z1159 Encounter for screening for other viral diseases: Secondary | ICD-10-CM

## 2021-05-03 DIAGNOSIS — Z113 Encounter for screening for infections with a predominantly sexual mode of transmission: Secondary | ICD-10-CM | POA: Diagnosis not present

## 2021-05-03 LAB — POCT WET PREP (WET MOUNT)
Clue Cells Wet Prep Whiff POC: NEGATIVE
Trichomonas Wet Prep HPF POC: ABSENT

## 2021-05-03 NOTE — Assessment & Plan Note (Signed)
Cytology + HPV. If normal, follow up in 5 years.

## 2021-05-03 NOTE — Assessment & Plan Note (Signed)
Obtained GC/C, wet prep, HIV, RPR. Follow up with results. Counseled on contraception and prenatal vitamins.

## 2021-05-03 NOTE — Patient Instructions (Signed)
For the labs obtained today, if everything is normal, I will send you a message on MyChart.  If there is anything abnormal, I will call you for further management. I would encourage you to start taking prenatal vitamins if you are considering having children.  It is important that you have folic acid supplementation to help with baby's growth, especially early on.  Please let us know if you need a prescription for prenatal vitamins.  Otherwise, you can get them on Amazon or at the pharmacy.

## 2021-05-03 NOTE — Progress Notes (Signed)
    SUBJECTIVE:   CHIEF COMPLAINT / HPI:   STD Testing  Patient presenting today for STD testing. She is currently sexually activee.  She is unsure if she wants to get pregnant at this time.  Is not taking any contraception at this time.   Denies any discharge, genital lesions, dysuria, dyspareunia.  Patient's last menstrual period was 04/26/2021 (approximate).. Last pap was 04/10/2018 and normal. Denies partner violence and reports feeling safe in relationships.  Eczema  Pruritic rash located on right anterior forearm and distal bicep area.  Patient reports she used hydrocortisone cream with relief of itching.  She has had this in the past.  Does not take an allergy medication daily.  PERTINENT  PMH / PSH: No history of abnormal pap smears.   OBJECTIVE:  BP 108/68   Ht 5\' 3"  (1.6 m)   Wt 148 lb (67.1 kg)   LMP 04/26/2021 (Approximate)   BMI 26.22 kg/m   General: well appearing, NAD Skin: papular rash on right anterior forearm with no erythema.  GU: External vulva and vagina nonerythematous, without any obvious lesions or rash.  no abnormal discharge appreciated.  Normal ruggae of vaginal walls.  Cervix is non erythematous and non-friable.  There is no cervical motion tenderness, masses or gross abnormalities appreciated during bimanual exam.   ASSESSMENT/PLAN:   Cervical cancer screening Cytology + HPV. If normal, follow up in 5 years.   Routine screening for STI (sexually transmitted infection) Obtained GC/C, wet prep, HIV, RPR. Follow up with results. Counseled on contraception and prenatal vitamins.   Eczema History and physical exam consistent with eczema.  Continue hydrocortisone cream twice daily until resolution of rash.  If no improvement with hydrocortisone cream, patient to call the office for stronger steroid cream.  04/28/2021, MD [facility]

## 2021-05-03 NOTE — Assessment & Plan Note (Signed)
History and physical exam consistent with eczema.  Continue hydrocortisone cream twice daily until resolution of rash.  If no improvement with hydrocortisone cream, patient to call the office for stronger steroid cream.

## 2021-05-04 LAB — HEPATITIS C ANTIBODY: Hep C Virus Ab: <0.1 s/co ratio (ref 0.0–0.9)

## 2021-05-04 LAB — RPR: RPR Ser Ql: NONREACTIVE

## 2021-05-04 LAB — HIV ANTIBODY (ROUTINE TESTING W REFLEX): HIV Screen 4th Generation wRfx: NONREACTIVE

## 2021-05-07 LAB — CYTOLOGY - PAP
Chlamydia: NEGATIVE
Comment: NEGATIVE
Comment: NEGATIVE
Comment: NORMAL
Diagnosis: UNDETERMINED — AB
High risk HPV: POSITIVE — AB
Neisseria Gonorrhea: NEGATIVE

## 2021-05-08 ENCOUNTER — Encounter: Payer: Self-pay | Admitting: Family Medicine

## 2021-05-17 ENCOUNTER — Other Ambulatory Visit: Payer: Self-pay

## 2021-05-17 ENCOUNTER — Ambulatory Visit: Payer: 59 | Admitting: Family Medicine

## 2021-05-17 VITALS — BP 102/62 | HR 78 | Wt 150.0 lb

## 2021-05-17 DIAGNOSIS — R87618 Other abnormal cytological findings on specimens from cervix uteri: Secondary | ICD-10-CM | POA: Diagnosis not present

## 2021-05-17 HISTORY — DX: Other abnormal cytological findings on specimens from cervix uteri: R87.618

## 2021-05-17 LAB — POCT URINE PREGNANCY: Preg Test, Ur: NEGATIVE

## 2021-05-17 NOTE — Progress Notes (Signed)
Patient is here for colposcopy response to recent Pap smear showing ASCUS with positive high risk HPV.  Previous Pap smear in 2014 and 2019 were negative.  Patient asymptomatic.Patient given informed consent, signed copy in the chart.  Placed in lithotomy position. Cervix viewed with speculum and colposcope after application of acetic acid.   Colposcopy adequate (entire squamocolumnar junctions seen  in entirety) ?  Yes, ectropion present Acetowhite lesions?  None Punctation?  None Mosaicism?  None Abnormal vasculature?  No Biopsies?  None ECC?  No Complications?  None  COMMENTS: Patient was given post procedure instructions.    Assessment: ASCUS with positive high risk HPV.  Possibly clinically normal.  Presence of ectropion noted.  Repeat Pap smear in 1 year with reflex testing for ASCUS recommended.  Answered all patient's questions.

## 2021-05-17 NOTE — Patient Instructions (Signed)
Today you had a colposcopy in response to the mildly abnormal results seen on your recent Pap smear.  The colposcopy revealed a completely normal cervix.  In this clinical setting, we recommend a repeat Pap smear in 1 year.  You may continue to have mildly abnormal Pap smears for a year or 2, so it is important to continue to follow-up regularly.  He did not have a biopsy so there are no restrictions.  You may go about your usual activities.

## 2021-05-17 NOTE — Assessment & Plan Note (Signed)
Recommend repeat Pap with reflex testing in 1 year.

## 2021-05-18 ENCOUNTER — Encounter: Payer: Self-pay | Admitting: Family Medicine

## 2021-11-11 NOTE — L&D Delivery Note (Signed)
Delivery Note At 2:39 AM a viable female was delivered via Vaginal, Spontaneous (Presentation:   Occiput Anterior).  APGAR: 9, 9; weight  pending  .   Placenta status: Spontaneous,   3 vessel  Cord:   with the following complications: None.  Cord pH: NA  Anesthesia: Epidural Episiotomy: None Lacerations: 2nd degree;Perineal;Periurethral Suture Repair: 3.0 vicryl Est. Blood Loss (mL):  125 mL  Mom to postpartum.  Baby to Couplet care / Skin to Skin.  Gerald Leitz 09/30/2022, 3:07 AM

## 2021-11-14 ENCOUNTER — Other Ambulatory Visit: Payer: Self-pay

## 2021-11-14 ENCOUNTER — Encounter (HOSPITAL_COMMUNITY): Payer: Self-pay | Admitting: Physician Assistant

## 2021-11-14 ENCOUNTER — Ambulatory Visit (INDEPENDENT_AMBULATORY_CARE_PROVIDER_SITE_OTHER): Payer: 59

## 2021-11-14 ENCOUNTER — Ambulatory Visit (HOSPITAL_COMMUNITY)
Admission: EM | Admit: 2021-11-14 | Discharge: 2021-11-14 | Disposition: A | Discharge status: Home / Self Care | Payer: 59 | Attending: Physician Assistant | Admitting: Physician Assistant

## 2021-11-14 DIAGNOSIS — M79671 Pain in right foot: Secondary | ICD-10-CM

## 2021-11-14 MED ORDER — NAPROXEN 500 MG PO TABS: 500.0000 mg | ORAL_TABLET | Freq: Two times a day (BID) | ORAL | 0 refills | Status: DC

## 2021-11-14 NOTE — ED Provider Notes (Signed)
[facility]    CSN: [id number] Arrival date & time: 11/14/21  1353      History   Chief Complaint Chief Complaint  Patient presents with   Ankle Pain    HPI Calie Buttrey Hicklin is a 31 y.o. female.   Patient presents today with a 1 day history of worsening right foot pain.  She denies any known injury or increase in activity prior to symptom onset.  Reports pain woke her up overnight and she has had increasing pain and swelling throughout the day prompting her to leave work early and be evaluated.  Pain is rated 6 on a 0-10 pain scale, localized to affected area without radiation, described as aching with periodic sharp pains, worse with palpation or attempted ambulation, no alleviating factors identified.  She denies any previous injury or surgery involving foot or ankle.  She denies any numbness or tingling in right foot.  She has not tried any over-the-counter medication for symptom management.   History reviewed. No pertinent past medical history.  Patient Active Problem List   Diagnosis Date Noted   Pap smear abnormality of cervix/human papillomavirus (HPV) positive 05/17/2021   Cervical cancer screening 05/03/2021   Routine screening for STI (sexually transmitted infection) 05/03/2021   Anemia 11/26/2020   Hypokalemia 11/26/2020   Screening for malignant neoplasm of cervix 04/10/2018   Flank pain 09/12/2017   Encounter for surveillance of contraceptive pills 09/12/2017   Eczema 01/08/2007    History reviewed. No pertinent surgical history.  OB History   No obstetric history on file.      Home Medications    Prior to Admission medications   Medication Sig Start Date End Date Taking? Authorizing Provider  naproxen (NAPROSYN) 500 MG tablet Take 1 tablet (500 mg total) by mouth 2 (two) times daily. 11/14/21  Yes Cyntha Brickman K, PA-C  cyclobenzaprine (FLEXERIL) 10 MG tablet Take 0.5 tablets (5 mg total) by mouth at bedtime. 11/24/20   Sandre Kitty, MD   dicyclomine (BENTYL) 20 MG tablet Take 1 tablet (20 mg total) by mouth 2 (two) times daily. 10/05/18   Garlon Hatchet, PA-C  ferrous sulfate 325 (65 FE) MG EC tablet Take 1 tablet (325 mg total) by mouth daily with breakfast. 12/12/20   Sandre Kitty, MD  omeprazole (PRILOSEC) 20 MG capsule Take 1 capsule (20 mg total) by mouth daily. 04/17/19   Mardella Layman, MD  ondansetron (ZOFRAN ODT) 4 MG disintegrating tablet Take 1 tablet (4 mg total) by mouth every 8 (eight) hours as needed for nausea. 10/05/18   Garlon Hatchet, PA-C    Family History Family History  Problem Relation Age of Onset   Healthy Mother     Social History Social History   Tobacco Use   Smoking status: Never   Smokeless tobacco: Never  Substance Use Topics   Alcohol use: Yes    Comment: occ   Drug use: No     Allergies   Patient has no known allergies.   Review of Systems Review of Systems  Constitutional:  Positive for activity change. Negative for appetite change, fatigue and fever.  Respiratory:  Negative for cough and shortness of breath.   Cardiovascular:  Negative for chest pain.  Gastrointestinal:  Negative for abdominal pain, diarrhea, nausea and vomiting.  Musculoskeletal:  Positive for arthralgias, gait problem and joint swelling. Negative for myalgias.  Skin:  Negative for color change and wound.  Neurological:  Negative for dizziness, light-headedness and headaches.  Physical Exam Triage Vital Signs ED Triage Vitals  Enc Vitals Group     BP 11/14/21 1513 115/76     Pulse Rate 11/14/21 1513 76     Resp 11/14/21 1513 16     Temp 11/14/21 1513 98.6 F (37 C)     Temp Source 11/14/21 1513 Oral     SpO2 11/14/21 1513 100 %     Weight --      Height --      Head Circumference --      Peak Flow --      Pain Score 11/14/21 1514 6     Pain Loc --      Pain Edu? --      Excl. in GC? --    No data found.  Updated Vital Signs BP 115/76 (BP Location: Left Arm)    Pulse 76    Temp 98.6  F (37 C) (Oral)    Resp 16    LMP 10/31/2021 (Approximate)    SpO2 100%   Visual Acuity Right Eye Distance:   Left Eye Distance:   Bilateral Distance:    Right Eye Near:   Left Eye Near:    Bilateral Near:     Physical Exam Vitals reviewed.  Constitutional:      General: She is awake. She is not in acute distress.    Appearance: Normal appearance. She is well-developed. She is not ill-appearing.     Comments: Very pleasant female appears stated age in no acute distress sitting comfortably in exam room  HENT:     Head: Normocephalic and atraumatic.  Cardiovascular:     Rate and Rhythm: Normal rate and regular rhythm.     Pulses:          Posterior tibial pulses are 2+ on the right side and 2+ on the left side.     Heart sounds: Normal heart sounds, S1 normal and S2 normal. No murmur heard.    Comments: Capillary refill within 2 seconds right toes Pulmonary:     Effort: Pulmonary effort is normal.     Breath sounds: Normal breath sounds. No wheezing, rhonchi or rales.     Comments: Clear to auscultation bilaterally Musculoskeletal:     Right lower leg: No edema.     Left lower leg: No edema.     Right foot: Decreased range of motion. No deformity.       Feet:  Feet:     Right foot:     Protective Sensation: 10 sites tested.  10 sites sensed.     Skin integrity: No ulcer, blister or skin breakdown.     Toenail Condition: Right toenails are normal.     Comments: Pain and swelling noted and base of fifth metatarsal.  No deformity noted.  Right foot neurovascularly intact.  Normal strength and active range of motion at ankle. Psychiatric:        Behavior: Behavior is cooperative.     UC Treatments / Results  Labs (all labs ordered are listed, but only abnormal results are displayed) Labs Reviewed - No data to display  EKG   Radiology DG Foot Complete Right  Result Date: 11/14/2021 CLINICAL DATA:  Pain and swelling fifth metatarsal. EXAM: RIGHT FOOT COMPLETE - 3+  VIEW COMPARISON:  None. FINDINGS: There is no evidence of fracture or dislocation. There is no evidence of arthropathy or other focal bone abnormality. Soft tissues are unremarkable. IMPRESSION: Negative. Electronically Signed   By: Darliss Cheney  M.D.   On: 11/14/2021 15:34    Procedures Procedures (including critical care time)  Medications Ordered in UC Medications - No data to display  Initial Impression / Assessment and Plan / UC Course  I have reviewed the triage vital signs and the nursing notes.  Pertinent labs & imaging results that were available during my care of the patient were reviewed by me and considered in my medical decision making (see chart for details).     X-ray obtained given tenderness over fifth metatarsal showed no acute osseous abnormality.  Patient was placed in postop shoe for comfort.  She was encouraged to use RICE protocol for additional symptom relief and was taken out of work for several days to allow for rest and healing.  She was started on Naprosyn twice daily with instruction not to take additional NSAIDs with this medication due to risk of GI bleeding.  Discussed that if symptoms or not improving she should follow-up with podiatry and was given contact information for local group with instruction to contact them if symptoms persist.  Discussed alarm symptoms that warrant emergent evaluation including increased pain, difficulty ambulating, swelling, numbness, paresthesias.  Strict return precautions given to which she expressed understanding.  Final Clinical Impressions(s) / UC Diagnoses   Final diagnoses:  Right foot pain     Discharge Instructions      Your x-ray was normal.  Please elevate your foot, use ice, wear postop shoe for several days to help symptoms.  Use Naprosyn twice daily for pain and inflammation.  Do not take additional NSAIDs including aspirin, ibuprofen/Advil, naproxen/Aleve with this medication as can cause stomach bleeding.  If  your symptoms or not improving please follow-up with podiatrist as we discussed.  If you have any severe symptoms including increased pain, inability to put weight on your foot, numbness, tingling sensation you need to be seen immediately.     ED Prescriptions     Medication Sig Dispense Auth. Provider   naproxen (NAPROSYN) 500 MG tablet Take 1 tablet (500 mg total) by mouth 2 (two) times daily. 20 tablet Amiel Mccaffrey, Noberto RetortErin K, PA-C      PDMP not reviewed this encounter.   Jeani HawkingRaspet, Aashna Matson K, PA-C 11/14/21 1552

## 2021-11-14 NOTE — Discharge Instructions (Signed)
Your x-ray was normal.  Please elevate your foot, use ice, wear postop shoe for several days to help symptoms.  Use Naprosyn twice daily for pain and inflammation.  Do not take additional NSAIDs including aspirin, ibuprofen/Advil, naproxen/Aleve with this medication as can cause stomach bleeding.  If your symptoms or not improving please follow-up with podiatrist as we discussed.  If you have any severe symptoms including increased pain, inability to put weight on your foot, numbness, tingling sensation you need to be seen immediately.

## 2021-11-14 NOTE — ED Triage Notes (Signed)
Pt presents for right ankle foot pain that started last night.

## 2022-01-28 ENCOUNTER — Other Ambulatory Visit: Payer: Self-pay

## 2022-01-28 ENCOUNTER — Ambulatory Visit (INDEPENDENT_AMBULATORY_CARE_PROVIDER_SITE_OTHER): Payer: 59 | Admitting: Family Medicine

## 2022-01-28 ENCOUNTER — Encounter: Payer: Self-pay | Admitting: Family Medicine

## 2022-01-28 ENCOUNTER — Other Ambulatory Visit (HOSPITAL_COMMUNITY)
Admission: RE | Admit: 2022-01-28 | Discharge: 2022-01-28 | Disposition: A | Discharge status: Home / Self Care | Payer: 59 | Source: Ambulatory Visit | Attending: Family Medicine | Admitting: Family Medicine

## 2022-01-28 VITALS — BP 117/81 | HR 99 | Wt 155.8 lb

## 2022-01-28 DIAGNOSIS — Z3201 Encounter for pregnancy test, result positive: Secondary | ICD-10-CM | POA: Diagnosis present

## 2022-01-28 DIAGNOSIS — N912 Amenorrhea, unspecified: Secondary | ICD-10-CM

## 2022-01-28 HISTORY — DX: Amenorrhea, unspecified: N91.2

## 2022-01-28 LAB — POCT URINE PREGNANCY: Preg Test, Ur: POSITIVE — AB

## 2022-01-28 MED ORDER — PRENATAL MULTIVITAMIN CH: 1.0000 | ORAL_TABLET | Freq: Every day | ORAL | 2 refills | Status: DC

## 2022-01-28 NOTE — Patient Instructions (Signed)
Congratulations!  ? ?We are excited to care for you during the exciting time.  ? ?Your first prenatal appointment will be on 02/20/22 at 3:10 PM with Dr. Posey Pronto.  ? ?Please start a prenatal vitamin every day.  ? ? ?Common Medications Safe in Pregnancy ? ?Acne:      Constipation: ? Benzoyl Peroxide     Colace ? Clindamycin      Dulcolax Suppository ? Topica Erythromycin     Fibercon ? Salicylic Acid      Metamucil ?        Miralax ?AVOID:        Senakot  ? Accutane    Cough: ? Retin-A       Cough Drops ? Tetracycline      Phenergan w/ Codeine if Rx ? Minocycline      Robitussin (Plain & DM) ? ?Antibiotics:     Crabs/Lice: ? Ceclor       RID ? Cephalosporins    AVOID: ? E-Mycins      Kwell ? Keflex ? Macrobid/Macrodantin   Diarrhea: ? Penicillin      Kao-Pectate ? Zithromax      Imodium AD ?        PUSH FLUIDS ?AVOID:      ? Cipro     Fever: ? Tetracycline      Tylenol (Regular or Extra ? Minocycline       Strength) ? Levaquin      Extra Strength-Do not          Exceed 8 tabs/24 hrs ?Caffeine:       ? <240m/day (equiv. To 1 cup of coffee or ? approx. 3 12 oz sodas)   ?      Gas: ?Cold/Hayfever:       Gas-X ? Benadryl      Mylicon ? Claritin       Phazyme ? **Claritin-D       ? Chlor-Trimeton    Headaches: ? Dimetapp      ASA-Free Excedrin ? Drixoral-Non-Drowsy     Cold Compress ? Mucinex (Guaifenasin)     Tylenol (Regular or Extra ? Sudafed/Sudafed-12 Hour     Strength) ? **Sudafed PE Pseudoephedrine  ? Tylenol Cold & Sinus    ? Vicks Vapor Rub ? Zyrtec ? ?**AVOID if Problems With Blood Pressure ? ? ? ?Heartburn: Avoid lying down for at least 1 hour after meals ? Aciphex     ? Maalox     Rash: ? Milk of Magnesia     Benadryl   ? Mylanta       1% Hydrocortisone Cream ? Pepcid ? Pepcid Complete   Sleep Aids: ? Prevacid      Ambien  ? Prilosec       Benadryl ? Rolaids       Chamomile Tea ? Tums (Limit 4/day)     Unisom ?        Tylenol PM ?        Warm milk-add vanilla or  ?Hemorrhoids:       Sugar for  taste ? Anusol/Anusol H.C. ? (RX: Analapram 2.5%)  Sugar Substitutes: ? Hydrocortisone OTC     Ok in moderation ? Preparation H     ? Tucks       ? Vaseline lotion applied to tissue with wiping   ? ?Herpes:     Throat: ? Acyclovir      Oragel ? Famvir ? Valtrex     Vaccines: ?  Flu Shot ?Leg Cramps:       *Gardasil ? Benadryl      Hepatitis A ?        Hepatitis B ?Nasal Spray:       Pneumovax ? Saline Nasal Spray     Polio Booster ?        Tetanus ?Nausea:       Tuberculosis test or PPD ? Vitamin B6 25 mg TID   AVOID:   ? Dramamine      *Gardasil ? Emetrol       Live Poliovirus ? Ginger Root 250 mg QID    MMR (measles, mumps & ? High Complex Carbs @ Bedtime    rebella) ? Sea Bands-Accupressure    Varicella (Chickenpox) ? Unisom 1/2 tab TID     *No known complications  ?         If received before ?Pain:         Known pregnancy;  ? Darvocet       Resume series after ? Lortab        Delivery ? Percocet    Yeast:  ? Tramadol      Femstat ? Tylenol 3      Gyne-lotrimin ? Ultram       Monistat ? Vicodin     ?      MISC: ?        All Sunscreens   ?        Hair Coloring/highlights  ?        Insect Repellant's ?         (Including DEET) ?        Mystic Tans ? ?

## 2022-01-28 NOTE — Assessment & Plan Note (Addendum)
Positive pregnancy test x2, patient desires to continue with pregnancy ?New OB labs ordered  ?Patient will schedule with [facility] for prenatal care  ?LMP 2/13 ?Reviewed medications ?

## 2022-01-28 NOTE — Assessment & Plan Note (Addendum)
New OB labs ordered  ?OB urine culture ordered  ?Discussed medications safe for pregnancy, patient denies currently taking any medications  ?Patient to start prenatal vitamin  ?F/u scheduled for 02/20/22 ?[place] collected today  ?

## 2022-01-28 NOTE — Progress Notes (Signed)
? ? ?  SUBJECTIVE:  ? ?CHIEF COMPLAINT / HPI: positive home pregnancy test  ? ?LMP was 2/13 and was normal for her. This is a planned pregnancy. She denies taking contraception in the last 2 months. This is first pregnancy for her.  ? ?PERTINENT  PMH / PSH: First pregnancy  ? ?OBJECTIVE:  ? ?BP 117/81   Pulse 99   Wt 155 lb 12.8 oz (70.7 kg)   LMP 12/24/2021   SpO2 100%   BMI 27.60 kg/m?   ?General: female appearing stated age in no acute distress ?Cardio: Normal S1 and S2, no S3 or S4. Rhythm is regular. No murmurs or rubs.  Bilateral radial pulses palpable ?Pulm: Clear to auscultation bilaterally, no crackles, wheezing, or diminished breath sounds. Normal respiratory effort, stable on RA ?Abdomen: Bowel sounds normal. Abdomen soft and non-tender.  ?Extremities: No peripheral edema. Warm/ well perfused. ? ? ?ASSESSMENT/PLAN:  ? ?Amenorrhea ?Positive pregnancy test x2, patient desires to continue with pregnancy ?New OB labs ordered  ?Patient will schedule with [facility] for prenatal care  ?LMP 2/13 ?Reviewed medications ? ?Positive pregnancy test ?New OB labs ordered  ?OB urine culture ordered  ?Discussed medications safe for pregnancy, patient denies currently taking any medications  ?Patient to start prenatal vitamin  ?F/u scheduled for 02/20/22 ?[facility] collected today  ?  ? ?Ronnald Ramp, MD ?[facility]  ?

## 2022-01-29 ENCOUNTER — Telehealth: Payer: Self-pay

## 2022-01-29 LAB — CERVICOVAGINAL ANCILLARY ONLY
Bacterial Vaginitis (gardnerella): NEGATIVE
Candida Glabrata: NEGATIVE
Candida Vaginitis: NEGATIVE
Chlamydia: NEGATIVE
Comment: NEGATIVE
Comment: NEGATIVE
Comment: NEGATIVE
Comment: NEGATIVE
Comment: NEGATIVE
Comment: NORMAL
Neisseria Gonorrhea: NEGATIVE
Trichomonas: NEGATIVE

## 2022-01-29 NOTE — Addendum Note (Signed)
Addended by: Bing Neighbors on: 01/29/2022 04:54 PM ? ? Modules accepted: Orders ? ?

## 2022-01-29 NOTE — Telephone Encounter (Signed)
Rec'd a prior auth from pharmacy regarding patients prenatal multivitamin. ? ?Available formulary alternative: CITRANATAL ? ?Would you like to send this med change or have me move forward with the PA for the original rx? ?

## 2022-01-30 LAB — MICROSCOPIC EXAMINATION
Casts: NONE SEEN /lpf
RBC, Urine: NONE SEEN /hpf (ref 0–2)
WBC, UA: NONE SEEN /hpf (ref 0–5)

## 2022-01-30 LAB — CBC/D/PLT+RPR+RH+ABO+RUBIGG...
Antibody Screen: NEGATIVE
Basophils Absolute: 0 10*3/uL (ref 0.0–0.2)
Basos: 0 %
Bilirubin, UA: NEGATIVE
EOS (ABSOLUTE): 0 10*3/uL (ref 0.0–0.4)
Eos: 0 %
Glucose, UA: NEGATIVE
HCV Ab: NONREACTIVE
HIV Screen 4th Generation wRfx: NONREACTIVE
Hematocrit: 35.4 % (ref 34.0–46.6)
Hemoglobin: 12 g/dL (ref 11.1–15.9)
Hepatitis B Surface Ag: NEGATIVE
Immature Grans (Abs): 0 10*3/uL (ref 0.0–0.1)
Immature Granulocytes: 0 %
Leukocytes,UA: NEGATIVE
Lymphocytes Absolute: 1.3 10*3/uL (ref 0.7–3.1)
Lymphs: 23 %
MCH: 31.7 pg (ref 26.6–33.0)
MCHC: 33.9 g/dL (ref 31.5–35.7)
MCV: 94 fL (ref 79–97)
Monocytes Absolute: 0.4 10*3/uL (ref 0.1–0.9)
Monocytes: 6 %
Neutrophils Absolute: 4 10*3/uL (ref 1.4–7.0)
Neutrophils: 71 %
Nitrite, UA: NEGATIVE
Platelets: 332 10*3/uL (ref 150–450)
RBC, UA: NEGATIVE
RBC: 3.78 x10E6/uL (ref 3.77–5.28)
RDW: 12.4 % (ref 11.7–15.4)
RPR Ser Ql: NONREACTIVE
Rh Factor: POSITIVE
Rubella Antibodies, IGG: 4.1 index (ref >0.99)
Specific Gravity, UA: 1.025 (ref 1.005–1.030)
Urobilinogen, Ur: 0.2 mg/dL (ref 0.2–1.0)
WBC: 5.7 10*3/uL (ref 3.4–10.8)
pH, UA: 6.5 (ref 5.0–7.5)

## 2022-01-30 LAB — HGB FRACTIONATION CASCADE
Hgb A2: 2.7 % (ref 1.8–3.2)
Hgb A: 97.3 % (ref 96.4–98.8)
Hgb F: 0 % (ref 0.0–2.0)
Hgb S: 0 %

## 2022-01-30 LAB — HCV INTERPRETATION

## 2022-01-30 LAB — URINE CULTURE, OB REFLEX

## 2022-01-30 MED ORDER — CITRANATAL 90 DHA 90-1 & 300 MG PO MISC: 1.0000 | Freq: Every day | ORAL | 2 refills | Status: DC

## 2022-01-30 NOTE — Addendum Note (Signed)
Addended by: Bing Neighbors on: 01/30/2022 12:26 PM ? ? Modules accepted: Orders ? ?

## 2022-01-31 ENCOUNTER — Other Ambulatory Visit (HOSPITAL_COMMUNITY): Payer: Self-pay

## 2022-01-31 ENCOUNTER — Encounter: Payer: Self-pay | Admitting: Family Medicine

## 2022-01-31 NOTE — Telephone Encounter (Signed)
Medication list updated to include Citranatal bloom PNV ?

## 2022-01-31 NOTE — Telephone Encounter (Signed)
Spoke with pharmacy regarding Citranatal RX that was sent. The RX sent was discontinued, but pharmacist changed it to the equivalent that is now covered; Citranatal Bloom. Insurance covered 30 tablets for 30 day supply at a time. Copay is $37 for patient. ? ?Could this be updated on her med list as well? ? ?Thanks so much!! ?

## 2022-01-31 NOTE — Addendum Note (Signed)
Addended by: Bing Neighbors on: 01/31/2022 11:42 AM ? ? Modules accepted: Orders ? ?

## 2022-02-04 ENCOUNTER — Telehealth: Payer: Self-pay

## 2022-02-04 ENCOUNTER — Other Ambulatory Visit: Payer: Self-pay | Admitting: Family Medicine

## 2022-02-04 DIAGNOSIS — R109 Unspecified abdominal pain: Secondary | ICD-10-CM

## 2022-02-04 NOTE — Telephone Encounter (Signed)
Called patient and informed her of upcoming appointment. ? ?Renal Ultrasound ?Friday 02/08/2022 ?[facility] ?1630 with 1600 arrival ?Patient is to have a full bladder ?Drink 32 oz's water ?Do not void ? ?Patient has MYCHART as well. ? ?.Glennie Hawk, CMA ? ?

## 2022-02-08 ENCOUNTER — Ambulatory Visit (HOSPITAL_COMMUNITY): Payer: 59

## 2022-02-12 ENCOUNTER — Encounter (HOSPITAL_COMMUNITY): Payer: Self-pay | Admitting: Family Medicine

## 2022-02-12 ENCOUNTER — Inpatient Hospital Stay (HOSPITAL_COMMUNITY): Payer: 59

## 2022-02-12 ENCOUNTER — Other Ambulatory Visit: Payer: Self-pay

## 2022-02-12 ENCOUNTER — Inpatient Hospital Stay (HOSPITAL_COMMUNITY)
Admission: AD | Admit: 2022-02-12 | Discharge: 2022-02-12 | Disposition: A | Discharge status: Home / Self Care | Payer: 59 | Attending: Family Medicine | Admitting: Family Medicine

## 2022-02-12 DIAGNOSIS — R1012 Left upper quadrant pain: Secondary | ICD-10-CM | POA: Insufficient documentation

## 2022-02-12 DIAGNOSIS — O26891 Other specified pregnancy related conditions, first trimester: Secondary | ICD-10-CM | POA: Diagnosis not present

## 2022-02-12 DIAGNOSIS — Z3A01 Less than 8 weeks gestation of pregnancy: Secondary | ICD-10-CM | POA: Diagnosis not present

## 2022-02-12 DIAGNOSIS — O26899 Other specified pregnancy related conditions, unspecified trimester: Secondary | ICD-10-CM

## 2022-02-12 LAB — COMPREHENSIVE METABOLIC PANEL
ALT: 9 U/L (ref 0–44)
AST: 25 U/L (ref 15–41)
Albumin: 4 g/dL (ref 3.5–5.0)
Alkaline Phosphatase: 45 U/L (ref 38–126)
Anion gap: 6 (ref 5–15)
BUN: 5 mg/dL — ABNORMAL LOW (ref 6–20)
CO2: 23 mmol/L (ref 22–32)
Calcium: 9.5 mg/dL (ref 8.9–10.3)
Chloride: 106 mmol/L (ref 98–111)
Creatinine, Ser: 0.7 mg/dL (ref 0.44–1.00)
GFR, Estimated: >60 mL/min (ref >60)
Glucose, Bld: 90 mg/dL (ref 70–99)
Potassium: 4.1 mmol/L (ref 3.5–5.1)
Sodium: 135 mmol/L (ref 135–145)
Total Bilirubin: 0.4 mg/dL (ref 0.3–1.2)
Total Protein: 7.5 g/dL (ref 6.5–8.1)

## 2022-02-12 LAB — CBC WITH DIFFERENTIAL/PLATELET
Abs Immature Granulocytes: 0.02 10*3/uL (ref 0.00–0.07)
Basophils Absolute: 0 10*3/uL (ref 0.0–0.1)
Basophils Relative: 0 %
Eosinophils Absolute: 0 10*3/uL (ref 0.0–0.5)
Eosinophils Relative: 0 %
HCT: 32.5 % — ABNORMAL LOW (ref 36.0–46.0)
Hemoglobin: 11.6 g/dL — ABNORMAL LOW (ref 12.0–15.0)
Immature Granulocytes: 0 %
Lymphocytes Relative: 15 %
Lymphs Abs: 1.2 10*3/uL (ref 0.7–4.0)
MCH: 32.6 pg (ref 26.0–34.0)
MCHC: 35.7 g/dL (ref 30.0–36.0)
MCV: 91.3 fL (ref 80.0–100.0)
Monocytes Absolute: 0.3 10*3/uL (ref 0.1–1.0)
Monocytes Relative: 4 %
Neutro Abs: 6.5 10*3/uL (ref 1.7–7.7)
Neutrophils Relative %: 81 %
Platelets: 314 10*3/uL (ref 150–400)
RBC: 3.56 MIL/uL — ABNORMAL LOW (ref 3.87–5.11)
RDW: 12.2 % (ref 11.5–15.5)
WBC: 8.1 10*3/uL (ref 4.0–10.5)
nRBC: 0 % (ref 0.0–0.2)

## 2022-02-12 LAB — WET PREP, GENITAL
Clue Cells Wet Prep HPF POC: NONE SEEN
Sperm: NONE SEEN
Trich, Wet Prep: NONE SEEN
WBC, Wet Prep HPF POC: <10 (ref <10)
Yeast Wet Prep HPF POC: NONE SEEN

## 2022-02-12 LAB — URINALYSIS, ROUTINE W REFLEX MICROSCOPIC
Bilirubin Urine: NEGATIVE
Glucose, UA: NEGATIVE mg/dL
Hgb urine dipstick: NEGATIVE
Ketones, ur: NEGATIVE mg/dL
Leukocytes,Ua: NEGATIVE
Nitrite: NEGATIVE
Protein, ur: NEGATIVE mg/dL
Specific Gravity, Urine: 1.023 (ref 1.005–1.030)
pH: 7 (ref 5.0–8.0)

## 2022-02-12 LAB — HCG, QUANTITATIVE, PREGNANCY: hCG, Beta Chain, Quant, S: 78658 m[IU]/mL — ABNORMAL HIGH (ref <5)

## 2022-02-12 LAB — LIPASE, BLOOD: Lipase: 42 U/L (ref 11–51)

## 2022-02-12 MED ORDER — ACETAMINOPHEN 500 MG PO TABS
1000.0000 mg | ORAL_TABLET | Freq: Once | ORAL | Status: AC
  Administered 2022-02-12: 1000 mg via ORAL
  Filled 2022-02-12: qty 2

## 2022-02-12 NOTE — Progress Notes (Signed)
Jessica Emly CNM in earlier to discuss test results and d/c plan. Written and verbal d/c instructions given and understanding voiced. 

## 2022-02-12 NOTE — MAU Provider Note (Signed)
?History  ?  ? ?CSN: [id number] ? ?Arrival date and time: 02/12/22 1604 ? ? Event Date/Time  ? First Provider Initiated Contact with Patient 02/12/22 1923   ?  ? ?Chief Complaint  ?Patient presents with  ? Abdominal Pain  ? ?Jamie Wells is a 31 y.o. G1P0 at 7654w1d by Definite LMP of Dec 24, 2021 [facility]  She presents today for Abdominal Pain. She states the pain is present on her left side under her breast and "comes and goes."  She states it started today and was initially a constant stabbing pain, but reports "now not as severe."  She rates the pain a 4/10 now, but was a 7/10 earlier.  She reports the pain is improved with laying flat on her back, but has no aggravating factors.  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  She states her route today was one that required lots of walking.  Patient reports she has a renal US scheduled tomorrow.  She also reports some intermittent lower abdominal cramping above the pelvis area. She states it occurs daily. She denies vaginal bleeding, discharge of concern, or issues with urination, constipation, or diarrhea.  She reports nausea, but is coping with crackers and ginger ale consumption.  ? ? ?OB History   ? ? Gravida  ?1  ? Para  ?   ? Term  ?   ? Preterm  ?   ? AB  ?   ? Living  ?   ?  ? ? SAB  ?   ? IAB  ?   ? Ectopic  ?   ? Multiple  ?   ? Live Births  ?   ?   ?  ?  ? ? ?No past medical history on file. ? ?No past surgical history on file. ? ?Family History  ?Problem Relation Age of Onset  ? Healthy Mother   ? ? ?Social History  ? ?Tobacco Use  ? Smoking status: Never  ? Smokeless tobacco: Never  ?Substance Use Topics  ? Alcohol use: Yes  ?  Comment: occ  ? Drug use: No  ? ? ?Allergies: No Known Allergies ? ?Medications Prior to Admission  ?Medication Sig Dispense Refill Last Dose  ? Prenatal-DSS-FeCb-FeGl-FA (CITRANATAL BLOOM PO) Take by mouth.     ? ? ?Review of Systems  ?Constitutional:   Negative for chills and fever.  ?Respiratory:  Negative for cough and shortness of breath.   ?Gastrointestinal:  Positive for abdominal pain and nausea. Negative for constipation, diarrhea and vomiting.  ?Genitourinary:  Negative for difficulty urinating, dysuria, vaginal bleeding and vaginal discharge.  ?Neurological:  Negative for dizziness, light-headedness and headaches.  ?Physical Exam  ? ?Blood pressure 128/73, pulse 84, temperature 98.3 ?F (36.8 ?C), temperature source Oral, resp. rate 18, height 5\' 3"  (1.6 m), weight 72.1 kg, last menstrual period 12/24/2021, SpO2 100 %. ? ?Physical Exam ?Vitals reviewed.  ?Constitutional:   ?   Appearance: Normal appearance. She is well-developed.  ?HENT:  ?   Head: Normocephalic and atraumatic.  ?Eyes:  ?   Conjunctiva/sclera: Conjunctivae normal.  ?Cardiovascular:  ?   Rate and Rhythm: Normal rate and regular rhythm.  ?Pulmonary:  ?   Effort: Pulmonary effort is normal. No respiratory distress.  ?   Breath sounds: Normal breath sounds.  ?Abdominal:  ?   General: Bowel sounds are normal.  ?   Tenderness: There  is abdominal tenderness in the left upper quadrant.  ?Musculoskeletal:     ?   General: Normal range of motion.  ?   Cervical back: Normal range of motion.  ?Skin: ?   General: Skin is warm and dry.  ?Neurological:  ?   Mental Status: She is alert and oriented to person, place, and time.  ?Psychiatric:     ?   Mood and Affect: Mood normal.     ?   Behavior: Behavior normal.  ? ? ?MAU Course  ?Procedures ?Results for orders placed or performed during the hospital encounter of 02/12/22 (from the past 24 hour(s))  ?Urinalysis, Routine w reflex microscopic Urine, Clean Catch     Status: None  ? Collection Time: 02/12/22  6:17 PM  ?Result Value Ref Range  ? Color, Urine YELLOW YELLOW  ? APPearance CLEAR CLEAR  ? Specific Gravity, Urine 1.023 1.005 - 1.030  ? pH 7.0 5.0 - 8.0  ? Glucose, UA NEGATIVE NEGATIVE mg/dL  ? Hgb urine dipstick NEGATIVE NEGATIVE  ? Bilirubin Urine  NEGATIVE NEGATIVE  ? Ketones, ur NEGATIVE NEGATIVE mg/dL  ? Protein, ur NEGATIVE NEGATIVE mg/dL  ? Nitrite NEGATIVE NEGATIVE  ? Leukocytes,Ua NEGATIVE NEGATIVE  ?hCG, quantitative, pregnancy     Status: Abnormal  ? Collection Time: 02/12/22  7:36 PM  ?Result Value Ref Range  ? hCG, Beta Chain, Quant, S 78,658 (H) <5 mIU/mL  ?Comprehensive metabolic panel     Status: Abnormal  ? Collection Time: 02/12/22  7:36 PM  ?Result Value Ref Range  ? Sodium 135 135 - 145 mmol/L  ? Potassium 4.1 3.5 - 5.1 mmol/L  ? Chloride 106 98 - 111 mmol/L  ? CO2 23 22 - 32 mmol/L  ? Glucose, Bld 90 70 - 99 mg/dL  ? BUN 5 (L) 6 - 20 mg/dL  ? Creatinine, Ser 0.70 0.44 - 1.00 mg/dL  ? Calcium 9.5 8.9 - 10.3 mg/dL  ? Total Protein 7.5 6.5 - 8.1 g/dL  ? Albumin 4.0 3.5 - 5.0 g/dL  ? AST 25 15 - 41 U/L  ? ALT 9 0 - 44 U/L  ? Alkaline Phosphatase 45 38 - 126 U/L  ? Total Bilirubin 0.4 0.3 - 1.2 mg/dL  ? GFR, Estimated >60 >60 mL/min  ? Anion gap 6 5 - 15  ?Lipase, blood     Status: None  ? Collection Time: 02/12/22  7:36 PM  ?Result Value Ref Range  ? Lipase 42 11 - 51 U/L  ?CBC with Differential/Platelet     Status: Abnormal  ? Collection Time: 02/12/22  7:36 PM  ?Result Value Ref Range  ? WBC 8.1 4.0 - 10.5 K/uL  ? RBC 3.56 (L) 3.87 - 5.11 MIL/uL  ? Hemoglobin 11.6 (L) 12.0 - 15.0 g/dL  ? HCT 32.5 (L) 36.0 - 46.0 %  ? MCV 91.3 80.0 - 100.0 fL  ? MCH 32.6 26.0 - 34.0 pg  ? MCHC 35.7 30.0 - 36.0 g/dL  ? RDW 12.2 11.5 - 15.5 %  ? Platelets 314 150 - 400 K/uL  ? nRBC 0.0 0.0 - 0.2 %  ? Neutrophils Relative % 81 %  ? Neutro Abs 6.5 1.7 - 7.7 K/uL  ? Lymphocytes Relative 15 %  ? Lymphs Abs 1.2 0.7 - 4.0 K/uL  ? Monocytes Relative 4 %  ? Monocytes Absolute 0.3 0.1 - 1.0 K/uL  ? Eosinophils Relative 0 %  ? Eosinophils Absolute 0.0 0.0 - 0.5 K/uL  ? Basophils Relative  0 %  ? Basophils Absolute 0.0 0.0 - 0.1 K/uL  ? Immature Granulocytes 0 %  ? Abs Immature Granulocytes 0.02 0.00 - 0.07 K/uL  ?Wet prep, genital     Status: None  ? Collection Time:  02/12/22  8:05 PM  ? Specimen: PATH Cytology Cervicovaginal Ancillary Only  ?Result Value Ref Range  ? Yeast Wet Prep HPF POC NONE SEEN NONE SEEN  ? Trich, Wet Prep NONE SEEN NONE SEEN  ? Clue Cells Wet Prep HPF POC NONE SEEN NONE SEEN  ? WBC, Wet Prep HPF POC <10 <10  ? Sperm NONE SEEN   ? ?US Abdomen Complete ? ?Result Date: 02/12/2022 ?CLINICAL DATA:  Acute left upper abdominal pain. Seven weeks pregnant. EXAM: ABDOMEN ULTRASOUND COMPLETE COMPARISON:  None. FINDINGS: Gallbladder: No gallstones or wall thickening visualized. No sonographic Murphy sign noted by sonographer. Common bile duct: Diameter: 2 mm. Liver: No focal lesion identified. Within normal limits in parenchymal echogenicity. Portal vein is patent on color Doppler imaging with normal direction of blood flow towards the liver. IVC: No abnormality visualized. Pancreas: Visualized portion unremarkable. Spleen: Size and appearance within normal limits. Right Kidney: Length: 11.7 cm. Echogenicity within normal limits. No mass or hydronephrosis visualized. Left Kidney: Length: 11.2 cm. Echogenicity within normal limits. No mass or hydronephrosis visualized. Abdominal aorta: No aneurysm visualized. Other findings: None. IMPRESSION: Unremarkable upper abdominal ultrasound. Electronically Signed   By: Tish Frederickson M.D.   On: 02/12/2022 20:59  ? ?US OB LESS THAN 14 WEEKS WITH OB TRANSVAGINAL ? ?Result Date: 02/12/2022 ?CLINICAL DATA:  Pregnant with abdominal pain on the left. EXAM: OBSTETRIC <14 WK [place] AND TRANSVAGINAL OB US TECHNIQUE: Both transabdominal and transvaginal ultrasound examinations were performed for complete evaluation of the gestation as well as the maternal uterus, adnexal regions, and pelvic cul-de-sac. Transvaginal technique was performed to assess early pregnancy. COMPARISON:  None. FINDINGS: Intrauterine gestational sac: Single Yolk sac:  Visualized. Embryo:  Visualized. Placenta: Appears to be developing at the fundal/anterior aspect but  difficult to localize at this early stage. Amniotic fluid: Visually normal. Cardiac Activity: Visualized. Heart Rate: 144 bpm MSD: Fetal pole visualized, not measured. CRL:  10.5 mm   7 w   1 d

## 2022-02-12 NOTE — MAU Note (Signed)
Jamie Wells is a 31 y.o. at [redacted]w[redacted]d here in MAU reporting:  mid to upper intermittent stabbing abdominal pain located on the left side that began today.  Denies VB ?LMP: 12/24/2021 ?Onset of complaint: today ?Pain score: 6 ?Vitals:  ? 02/12/22 1627  ?BP: 128/73  ?Pulse: 84  ?Resp: 18  ?Temp: 98.3 ?F (36.8 ?C)  ?SpO2: 100%  ?   ?FHT:N/A ?Lab orders placed from triage:   UA ?

## 2022-02-12 NOTE — MAU Note (Signed)
Pt instructed in obtaining vag swabs which she did without difficulty. 

## 2022-02-12 NOTE — MAU Note (Signed)
Jessica Emly CNM in Family Rm to see pt and discuss plan of care. 

## 2022-02-12 NOTE — Discharge Instructions (Signed)

## 2022-02-13 ENCOUNTER — Ambulatory Visit
Admission: RE | Admit: 2022-02-13 | Discharge: 2022-02-13 | Disposition: A | Discharge status: Home / Self Care | Payer: 59 | Source: Ambulatory Visit | Attending: Family Medicine | Admitting: Family Medicine

## 2022-02-13 ENCOUNTER — Ambulatory Visit (HOSPITAL_COMMUNITY): Admission: RE | Admit: 2022-02-13 | Payer: 59 | Source: Ambulatory Visit

## 2022-02-13 ENCOUNTER — Encounter (HOSPITAL_COMMUNITY): Payer: Self-pay

## 2022-02-13 DIAGNOSIS — R109 Unspecified abdominal pain: Secondary | ICD-10-CM

## 2022-02-13 LAB — GC/CHLAMYDIA PROBE AMP (~~LOC~~) NOT AT ARMC
Chlamydia: NEGATIVE
Comment: NEGATIVE
Comment: NORMAL
Neisseria Gonorrhea: NEGATIVE

## 2022-02-20 ENCOUNTER — Ambulatory Visit (INDEPENDENT_AMBULATORY_CARE_PROVIDER_SITE_OTHER): Payer: 59 | Admitting: Family Medicine

## 2022-02-20 ENCOUNTER — Ambulatory Visit (INDEPENDENT_AMBULATORY_CARE_PROVIDER_SITE_OTHER): Payer: 59

## 2022-02-20 VITALS — BP 118/80 | HR 84 | Wt 160.0 lb

## 2022-02-20 DIAGNOSIS — Z23 Encounter for immunization: Secondary | ICD-10-CM | POA: Diagnosis not present

## 2022-02-20 DIAGNOSIS — Z3401 Encounter for supervision of normal first pregnancy, first trimester: Secondary | ICD-10-CM

## 2022-02-20 DIAGNOSIS — Z3A08 8 weeks gestation of pregnancy: Secondary | ICD-10-CM

## 2022-02-20 MED ORDER — FLUTICASONE PROPIONATE 50 MCG/ACT NA SUSP: 2.0000 | Freq: Every day | NASAL | 6 refills | Status: DC

## 2022-02-20 NOTE — Progress Notes (Signed)
?Patient Name: Jamie Wells ?Date of Birth: [birth year] ?[facility] Initial Prenatal Visit ? ?Jamie Wells is a 31 y.o. year old G1P0 at [redacted]w[redacted]d who presents for her initial prenatal visit. [facility] 09/30/2022. ?Pregnancy is planned ?She reports none. ?She is taking a prenatal vitamin.  ?She denies pelvic pain or vaginal bleeding.  ? ?Pregnancy Dating: ?The patient is dated by LMP.  ?LMP: 12/24/21 ?Period is certain:  Yes.  ?Periods were regular:  Yes.  ?LMP was a typical period:  Yes.  ?Using hormonal contraception in 3 months prior to conception: No ? ?Lab Review: ?Blood type: B ?Rh Status: + ?Antibody screen: Negative ?HIV: Negative ?RPR: Negative ?Hemoglobin electrophoresis reviewed: No ?Results of OB urine culture are: mixed urogenital flora  ?Rubella: Immune ?Hep C Ab: Negative ?Varicella status is Immune ? ?PMH: Reviewed and as detailed below: ?HTN: No  ?Gestational Hypertension/preeclampsia: No  ?Type 1 or 2 Diabetes: No  ?Depression:  No  ?Seizure disorder:  No ?VTE: No ,  ?History of STI No,  ?Abnormal Pap smear:  No, ?Genital herpes simplex:  No  ? ?PSH: ?Gynecologic Surgery:  no ?Surgical history reviewed no surgeons  ? ?Obstetric History: ?Obstetric history tab updated and reviewed.  ?Summary of prior pregnancies: none  ?Cesarean delivery: No  ?Gestational Diabetes:  No ?Hypertension in pregnancy: No ?History of preterm birth: No ?History of LGA/SGA infant:  No ?History of shoulder dystocia: No ?Indications for referral were reviewed, and the patient has no obstetric indications for referral to High Risk OB Clinic at this time.  ? ?Social History: ?Partner's name: Dawna Part   ?Tobacco use: No ?Alcohol use:  No ?Other substance use:  No ? ?Current Medications:  ?Prenatals   ?Reviewed and appropriate in pregnancy.  ? ?Genetic and Infection Screen: ?Flow Sheet Updated Yes ? ?Prenatal Exam: ?Gen: Well nourished, well developed.  No distress.  Vitals noted. ?HEENT: Normocephalic,  atraumatic.  Neck supple without cervical lymphadenopathy, thyromegaly or thyroid nodules.  Fair dentition. ?CV: RRR no murmur, gallops or rubs ?Lungs: CTAB.  Normal respiratory effort without wheezes or rales. ?Abd: soft, NTND. +BS.  Uterus not appreciated above pelvis. ?GU: Normal external female genitalia without lesions.  Nl vaginal, well rugated without lesions. No vaginal discharge.  Bimanual exam: No adnexal mass or TTP. No CMT.  ?Ext: No clubbing, cyanosis or edema. ?Psych: Normal grooming and dress.  Not depressed or anxious appearing.  Normal thought content and process without flight of ideas or looseness of associations ? ?Fetal heart tones: too early  ? ?Assessment/Plan: ? ?Jamie Wells is a 31 y.o. G1P0 at [redacted]w[redacted]d who presents to initiate prenatal care. She is doing well.  ?Current pregnancy issues include none . ? ?Routine prenatal care: ?As dating is reliable, a dating ultrasound has not been ordered. Dating tab updated. ?Pre-pregnancy weight updated. Expected weight gain this pregnancy is 15-25 pounds ?Prenatal labs reviewed. ?Indications for referral to [facility] were reviewed and the patient does not meet criteria for referral.  ?Medication list reviewed and updated.  ?Recommended patient see a dentist for regular care.  ?Bleeding and pain precautions reviewed. ?Importance of prenatal vitamins reviewed.  ?Pt would like [facility] [facility] for genetic screening after 11 weeks  ?The patient has the following indications for aspirinto begin 81 mg at 12-16 weeks: ?One high risk condition: no single high risk condition  ?MORE than one moderate risk condition: nulliparity, obesity, and identifies as African American  ?Aspirin was  recommended today based upon above  risk factors (one high risk condition or more than one moderate risk factor)  ?The patient will not be age 32 or over at time of delivery. Referral to genetic counseling was not offered today.  ?The patient has the following risk factors for  preexisting diabetes: BMI > 25 and high risk ethnicity (Latino, [place] American, Native American, [place] Islander, Asian Naval architect) . An early 1 hour glucose tolerance test was ordered. ?Pregnancy Medical Home and PHQ-9 forms completed, problems noted: Yes ? ?2. Pregnancy issues include the following which were addressed today:  ?Flonase recommended for allergies ?Pt will come in tomorrow for glucola and repeat CBC, ferritin, folate, b12.  ?  ?Follow up 4 weeks for next prenatal visit. ? ? ? ?

## 2022-02-20 NOTE — Patient Instructions (Signed)
Thank you for coming to see me today. It was a pleasure.congrats on your pregnancy. ? ?Everything looks good. Come in for sugar test and blood work tomorrow ? ?Aspirin att 12 weeks ?Genetic testing at next visit  ? ? ?Go to the MAU at [facility] if: ?You have cramping/contractions that do not go away with drinking water ?You have vaginal bleeding.    ?  ? ?If you have any questions or concerns, please do not hesitate to call the office at [phone]. ? ?Best wishes,  ? ?Dr Allena Katz   ?

## 2022-02-21 ENCOUNTER — Other Ambulatory Visit: Payer: 59

## 2022-02-21 DIAGNOSIS — Z3A08 8 weeks gestation of pregnancy: Secondary | ICD-10-CM

## 2022-02-22 LAB — CBC
Hematocrit: 33.3 % — ABNORMAL LOW (ref 34.0–46.6)
Hemoglobin: 11.6 g/dL (ref 11.1–15.9)
MCH: 32 pg (ref 26.6–33.0)
MCHC: 34.8 g/dL (ref 31.5–35.7)
MCV: 92 fL (ref 79–97)
Platelets: 306 10*3/uL (ref 150–450)
RBC: 3.62 x10E6/uL — ABNORMAL LOW (ref 3.77–5.28)
RDW: 12.2 % (ref 11.7–15.4)
WBC: 6.1 10*3/uL (ref 3.4–10.8)

## 2022-02-22 LAB — FOLATE: Folate: >20 ng/mL (ref >3.0)

## 2022-02-22 LAB — VITAMIN B12: Vitamin B-12: 640 pg/mL (ref 232–1245)

## 2022-02-22 LAB — GLUCOSE TOLERANCE, 1 HOUR: Glucose, 1Hr PP: 86 mg/dL (ref 70–199)

## 2022-02-22 LAB — FERRITIN: Ferritin: 30 ng/mL (ref 15–150)

## 2022-02-24 DIAGNOSIS — Z3401 Encounter for supervision of normal first pregnancy, first trimester: Secondary | ICD-10-CM | POA: Insufficient documentation

## 2022-02-24 NOTE — Assessment & Plan Note (Signed)
?  Nursing Staff Provider  ?Office Location  [facility]  Dating  LMP  ?Language  English  Anatomy US    ?Flu Vaccine   Genetic Screen  NIPS:   AFP:   First Screen:  Quad:  ?  ?TDaP vaccine    Hgb A1C or  ?GTT Early  ?Third trimester   ?Rhogam     LAB RESULTS   ?Feeding Plan Breast Blood Type B/Positive/-- (03/20 1143)   ?Contraception OCP Antibody Negative (03/20 1143)  ?Circumcision Yes  Rubella 4.10 (03/20 1143)  ?Pediatrician   RPR Non Reactive (03/20 1143)   ?Support Person  HBsAg Negative (03/20 1143)   ?Prenatal Classes  HIV Non Reactive (03/20 1143)  ?BTL Consent  GBS  (For PCN allergy, check sensitivities)   ?VBAC Consent  Pap   ?Resident PCP  [facility]    ?COVID Vax   CF   ?Hep C Antibody  neg SMA   ?Aspirin indicated  yes Waterbirth  Refer to [facility] -> [ ]  Class [ ]  Consent [ ]  CNM visit  ? ? ?

## 2022-03-25 ENCOUNTER — Encounter: Payer: 59 | Admitting: Family Medicine

## 2022-03-28 LAB — OB RESULTS CONSOLE GC/CHLAMYDIA
Chlamydia: NEGATIVE
Neisseria Gonorrhea: NEGATIVE

## 2022-04-16 ENCOUNTER — Encounter: Payer: Self-pay | Admitting: *Deleted

## 2022-07-12 ENCOUNTER — Other Ambulatory Visit: Payer: Self-pay

## 2022-07-12 ENCOUNTER — Encounter (HOSPITAL_COMMUNITY): Payer: Self-pay | Admitting: Obstetrics & Gynecology

## 2022-07-12 ENCOUNTER — Inpatient Hospital Stay (HOSPITAL_COMMUNITY)
Admission: AD | Admit: 2022-07-12 | Discharge: 2022-07-12 | Disposition: A | Discharge status: Home / Self Care | Payer: 59 | Attending: Obstetrics & Gynecology | Admitting: Obstetrics & Gynecology

## 2022-07-12 DIAGNOSIS — K219 Gastro-esophageal reflux disease without esophagitis: Secondary | ICD-10-CM

## 2022-07-12 DIAGNOSIS — O26899 Other specified pregnancy related conditions, unspecified trimester: Secondary | ICD-10-CM

## 2022-07-12 DIAGNOSIS — Z3A28 28 weeks gestation of pregnancy: Secondary | ICD-10-CM | POA: Diagnosis present

## 2022-07-12 DIAGNOSIS — O99891 Other specified diseases and conditions complicating pregnancy: Secondary | ICD-10-CM | POA: Insufficient documentation

## 2022-07-12 DIAGNOSIS — O99613 Diseases of the digestive system complicating pregnancy, third trimester: Secondary | ICD-10-CM | POA: Insufficient documentation

## 2022-07-12 DIAGNOSIS — R102 Pelvic and perineal pain: Secondary | ICD-10-CM | POA: Insufficient documentation

## 2022-07-12 DIAGNOSIS — O26893 Other specified pregnancy related conditions, third trimester: Secondary | ICD-10-CM | POA: Insufficient documentation

## 2022-07-12 DIAGNOSIS — R079 Chest pain, unspecified: Secondary | ICD-10-CM | POA: Diagnosis not present

## 2022-07-12 DIAGNOSIS — R1010 Upper abdominal pain, unspecified: Secondary | ICD-10-CM | POA: Insufficient documentation

## 2022-07-12 LAB — URINALYSIS, ROUTINE W REFLEX MICROSCOPIC
Bilirubin Urine: NEGATIVE
Glucose, UA: NEGATIVE mg/dL
Hgb urine dipstick: NEGATIVE
Ketones, ur: 15 mg/dL — AB
Nitrite: NEGATIVE
Protein, ur: NEGATIVE mg/dL
Specific Gravity, Urine: 1.02 (ref 1.005–1.030)
pH: 6.5 (ref 5.0–8.0)

## 2022-07-12 LAB — URINALYSIS, MICROSCOPIC (REFLEX)

## 2022-07-12 MED ORDER — FAMOTIDINE 20 MG PO TABS
20.0000 mg | ORAL_TABLET | Freq: Once | ORAL | Status: AC
  Administered 2022-07-12: 20 mg via ORAL
  Filled 2022-07-12: qty 1

## 2022-07-12 MED ORDER — FAMOTIDINE 20 MG PO TABS
20.0000 mg | ORAL_TABLET | Freq: Two times a day (BID) | ORAL | 2 refills | Status: DC
Start: 2022-07-12 — End: 2023-10-24

## 2022-07-12 NOTE — MAU Provider Note (Signed)
History     CSN: [id number]  Arrival date and time: 07/12/22 1409   Event Date/Time   First Provider Initiated Contact with Patient 07/12/22 1454      Chief Complaint  Patient presents with   Abdominal Pain   Chest Pain   Jamie Wells is a 31 y.o.p G1P0 at [redacted]w[redacted]d who presents today with lower abdominal pain. SHe has had this off and on for a few days. She states that it comes and goes. She may feel the pain 1-2 times per hour. She denies any VB or LOF. She reports normal fetal movement.   She is also having burning pain in her upper abdomen. This has been going on for a while now. She states that when she takes pepcid it does get better. However, it then returns by evening. She is taking 20mg  qam.   Abdominal Pain This is a new problem. The current episode started in the past 7 days. The problem occurs intermittently. The problem has been unchanged. The pain is located in the epigastric region. The pain is at a severity of 5/10. The quality of the pain is burning. The abdominal pain does not radiate. Nothing aggravates the pain. She has tried H2 blockers for the symptoms. The treatment provided moderate relief.  Pelvic Pain The patient's primary symptoms include pelvic pain. This is a new problem. The current episode started more than 1 month ago. The problem occurs intermittently. The problem has been unchanged. The problem affects both sides. She is pregnant. Associated symptoms include abdominal pain. The vaginal discharge was normal. There has been no bleeding. Nothing aggravates the symptoms. She has tried nothing for the symptoms.    OB History     Gravida  1   Para      Term      Preterm      AB      Living         SAB      IAB      Ectopic      Multiple      Live Births              History reviewed. No pertinent past medical history.  History reviewed. No pertinent surgical history.  Family History  Problem Relation Age of Onset   Healthy  Mother     Social History   Tobacco Use   Smoking status: Never   Smokeless tobacco: Never  Substance Use Topics   Alcohol use: Yes    Comment: occ   Drug use: No    Allergies: No Known Allergies  Medications Prior to Admission  Medication Sig Dispense Refill Last Dose   Prenatal-DSS-FeCb-FeGl-FA (CITRANATAL BLOOM PO) Take by mouth.   07/12/2022   fluticasone (FLONASE) 50 MCG/ACT nasal spray Place 2 sprays into both nostrils daily. 16 g 6 More than a month    Review of Systems  Gastrointestinal:  Positive for abdominal pain.  Genitourinary:  Positive for pelvic pain.  All other systems reviewed and are negative.  Physical Exam   Blood pressure 110/74, pulse 81, temperature 98 F (36.7 C), temperature source Oral, resp. rate 14, last menstrual period 12/24/2021, SpO2 99 %.  Physical Exam Constitutional:      Appearance: She is well-developed.  HENT:     Head: Normocephalic.  Eyes:     Pupils: Pupils are equal, round, and reactive to light.  Cardiovascular:     Rate and Rhythm: Normal rate and regular rhythm.  Heart sounds: Normal heart sounds.  Pulmonary:     Effort: Pulmonary effort is normal. No respiratory distress.     Breath sounds: Normal breath sounds.  Abdominal:     Palpations: Abdomen is soft.     Tenderness: There is no abdominal tenderness.  Genitourinary:    Vagina: No bleeding. Vaginal discharge: mucusy.    Comments: External: no lesion Vagina: small amount of white discharge     Musculoskeletal:        General: Normal range of motion.     Cervical back: Normal range of motion and neck supple.  Skin:    General: Skin is warm and dry.  Neurological:     Mental Status: She is alert and oriented to person, place, and time.  Psychiatric:        Mood and Affect: Mood normal.        Behavior: Behavior normal.     Dilation: Closed Exam by:: Thressa Sheller, CNM  NST:  Baseline: 135 Variability: moderate Accels: 15x15 Decels: none Toco:  none Reactive/Appropriate for GA  Results for orders placed or performed during the hospital encounter of 07/12/22 (from the past 24 hour(s))  Urinalysis, Routine w reflex microscopic Urine, Clean Catch     Status: Abnormal   Collection Time: 07/12/22  2:19 PM  Result Value Ref Range   Color, Urine YELLOW YELLOW   APPearance HAZY (A) CLEAR   Specific Gravity, Urine 1.020 1.005 - 1.030   pH 6.5 5.0 - 8.0   Glucose, UA NEGATIVE NEGATIVE mg/dL   Hgb urine dipstick NEGATIVE NEGATIVE   Bilirubin Urine NEGATIVE NEGATIVE   Ketones, ur 15 (A) NEGATIVE mg/dL   Protein, ur NEGATIVE NEGATIVE mg/dL   Nitrite NEGATIVE NEGATIVE   Leukocytes,Ua SMALL (A) NEGATIVE  Urinalysis, Microscopic (reflex)     Status: Abnormal   Collection Time: 07/12/22  2:19 PM  Result Value Ref Range   RBC / HPF 0-5 0 - 5 RBC/hpf   WBC, UA 6-10 0 - 5 WBC/hpf   Bacteria, UA FEW (A) NONE SEEN   Squamous Epithelial / LPF 0-5 0 - 5      MAU Course  Procedures  MDM Patient give pepcid and burning epigastric pain has improved.   Assessment and Plan   1. Gastroesophageal reflux disease, unspecified whether esophagitis present   2. Pain of round ligament affecting pregnancy, antepartum   3. [redacted] weeks gestation of pregnancy    DC home in stable condition  Comfort measures reviewed  3rd Trimester precautions  PTL precautions  Fetal kick counts RX: pepcid 20mg  BID #60  Return to MAU as needed FU with OB as planned   Follow-up Information     [facility] Follow up.   Specialty: Obstetrics and Gynecology Contact information: [address] [place] [phone]               [phone] DNP, CNM  07/12/22  3:32 PM

## 2022-07-12 NOTE — MAU Note (Addendum)
.  Jamie Wells is a 31 y.o. at [redacted]w[redacted]d here in MAU reporting: lower abdominal pain that comes and goes every 30-45 minutes for the last two days. Denies VB or LOF. Has had an increase in white sticky discharge. Endorses good fetal movement. Has also noticed burning in her chest for the last two days as well, she states it feels worse than her usual heartburn. Has been taking 20mg  of pepcid in the morning which seems to help, but then it wears off around 1400.   Pain score: abdominal pain- 4 chest burning- 5 Vitals:   07/12/22 1429  BP: 115/73  Pulse: 82  Resp: 14  Temp: 98 F (36.7 C)  SpO2: 99%     FHT:139 Lab orders placed from triage:  UA

## 2022-07-12 NOTE — Discharge Instructions (Signed)

## 2022-09-06 LAB — OB RESULTS CONSOLE GBS: GBS: NEGATIVE

## 2022-09-09 ENCOUNTER — Telehealth (HOSPITAL_COMMUNITY): Payer: Self-pay

## 2022-09-09 NOTE — Telephone Encounter (Signed)
Preadmission screening 

## 2022-09-29 ENCOUNTER — Encounter (HOSPITAL_COMMUNITY): Payer: Self-pay | Admitting: Obstetrics and Gynecology

## 2022-09-29 ENCOUNTER — Inpatient Hospital Stay (HOSPITAL_COMMUNITY)
Admission: AD | Admit: 2022-09-29 | Discharge: 2022-10-01 | DRG: 807 | Disposition: A | Discharge status: Home / Self Care | Payer: 59 | Attending: Obstetrics and Gynecology | Admitting: Obstetrics and Gynecology

## 2022-09-29 DIAGNOSIS — O26893 Other specified pregnancy related conditions, third trimester: Secondary | ICD-10-CM | POA: Diagnosis present

## 2022-09-29 DIAGNOSIS — Z3A39 39 weeks gestation of pregnancy: Secondary | ICD-10-CM

## 2022-09-29 DIAGNOSIS — Z23 Encounter for immunization: Secondary | ICD-10-CM

## 2022-09-29 DIAGNOSIS — R030 Elevated blood-pressure reading, without diagnosis of hypertension: Secondary | ICD-10-CM | POA: Diagnosis present

## 2022-09-29 HISTORY — DX: Other specified health status: Z78.9

## 2022-09-29 MED ORDER — LACTATED RINGERS IV SOLN
500.0000 mL | INTRAVENOUS | Status: DC | PRN
  Administered 2022-09-30: 1000 mL via INTRAVENOUS

## 2022-09-29 NOTE — H&P (Signed)
Jamie Wells is a 31 y.o. female G1P0 at 39 weeks and 6 days presenting  complaining of regular contractions every 3 -4 minutes. Contractiosn started yesterday . No lOF no vaginal bleeding. Prenatal care provided by [facility].   . OB History     Gravida  1   Para      Term      Preterm      AB      Living         SAB      IAB      Ectopic      Multiple      Live Births             Past Medical History:  Diagnosis Date   Medical history non-contributory    Past Surgical History:  Procedure Laterality Date   NO PAST SURGERIES     Family History: family history includes Healthy in her mother. Social History:  reports that she has never smoked. She has never used smokeless tobacco. She reports that she does not currently use alcohol. She reports that she does not use drugs.     Maternal Diabetes: No Genetic Screening: Normal Maternal Ultrasounds/Referrals: Normal Fetal Ultrasounds or other Referrals:  None Maternal Substance Abuse:  No Significant Maternal Medications:  None Significant Maternal Lab Results:  Group B Strep negative Number of Prenatal Visits:greater than 3 verified prenatal visits Other Comments:  None  Review of Systems  Constitutional: Negative.   HENT: Negative.    Eyes: Negative.   Respiratory: Negative.    Cardiovascular: Negative.   Gastrointestinal: Negative.   Endocrine: Negative.   Genitourinary: Negative.   Musculoskeletal: Negative.   Skin: Negative.   Allergic/Immunologic: Negative.   Neurological: Negative.   Hematological: Negative.   Psychiatric/Behavioral: Negative.     History   Blood pressure (!) 134/93, pulse 82, last menstrual period 12/24/2021. Maternal Exam:  Introitus: Normal vulva.   Physical Exam Vitals reviewed.  Constitutional:      Appearance: Normal appearance.  HENT:     Head: Normocephalic and atraumatic.     Nose: Nose normal.     Mouth/Throat:     Mouth: Mucous  membranes are moist.  Cardiovascular:     Rate and Rhythm: Normal rate and regular rhythm.  Pulmonary:     Effort: Pulmonary effort is normal.     Breath sounds: Normal breath sounds.  Abdominal:     Tenderness: There is no abdominal tenderness.  Genitourinary:    General: Normal vulva.  Musculoskeletal:        General: Swelling present. Normal range of motion.     Cervical back: Normal range of motion and neck supple.  Skin:    General: Skin is warm and dry.  Neurological:     General: No focal deficit present.     Mental Status: She is alert and oriented to person, place, and time.  Psychiatric:        Mood and Affect: Mood normal.        Behavior: Behavior normal.     Prenatal labs: ABO, Rh: B/Positive/-- (03/20 1143) Antibody: Negative (03/20 1143) Rubella: 4.10 (03/20 1143) RPR: Non Reactive (03/20 1143)  HBsAg: Negative (03/20 1143)  HIV: Non Reactive (03/20 1143)  GBS: Negative/-- (10/27 0000)   Assessment/Plan: 39 weeks and 6 days in Active labor  - admit to labor and delivery - fetal tachycardia - IV fluid bolus  - mildly elevated bp on admission - will  order PIH labs. - pain control per patient preference   Anticipate SVD    Gerald Leitz 09/29/2022, 11:48 PM

## 2022-09-29 NOTE — MAU Note (Signed)
.  Jamie Wells is a 31 y.o. at [redacted]w[redacted]d here in MAU reporting: ctx since yesterday. Got stronger today. About 4-5 min apart now . Denies leaking but reports bloody mucusy discharge. good fetal movement felt LMP:  Onset of complaint: yesterday Pain score: 8 There were no vitals filed for this visit.   FHT:179 Lab orders placed from triage:

## 2022-09-30 ENCOUNTER — Inpatient Hospital Stay (HOSPITAL_COMMUNITY): Payer: 59 | Admitting: Anesthesiology

## 2022-09-30 ENCOUNTER — Encounter (HOSPITAL_COMMUNITY): Payer: Self-pay | Admitting: Obstetrics and Gynecology

## 2022-09-30 ENCOUNTER — Other Ambulatory Visit: Payer: Self-pay

## 2022-09-30 ENCOUNTER — Inpatient Hospital Stay (HOSPITAL_COMMUNITY): Admission: RE | Admit: 2022-09-30 | Payer: 59 | Source: Home / Self Care | Admitting: Obstetrics and Gynecology

## 2022-09-30 DIAGNOSIS — Z23 Encounter for immunization: Secondary | ICD-10-CM | POA: Diagnosis not present

## 2022-09-30 DIAGNOSIS — Z3A39 39 weeks gestation of pregnancy: Secondary | ICD-10-CM | POA: Diagnosis not present

## 2022-09-30 DIAGNOSIS — R030 Elevated blood-pressure reading, without diagnosis of hypertension: Secondary | ICD-10-CM | POA: Diagnosis present

## 2022-09-30 DIAGNOSIS — O26893 Other specified pregnancy related conditions, third trimester: Secondary | ICD-10-CM | POA: Diagnosis present

## 2022-09-30 LAB — COMPREHENSIVE METABOLIC PANEL
ALT: 10 U/L (ref 0–44)
AST: 26 U/L (ref 15–41)
Albumin: 3.4 g/dL — ABNORMAL LOW (ref 3.5–5.0)
Alkaline Phosphatase: 127 U/L — ABNORMAL HIGH (ref 38–126)
Anion gap: 17 — ABNORMAL HIGH (ref 5–15)
BUN: <5 mg/dL — ABNORMAL LOW (ref 6–20)
CO2: 21 mmol/L — ABNORMAL LOW (ref 22–32)
Calcium: 10.4 mg/dL — ABNORMAL HIGH (ref 8.9–10.3)
Chloride: 101 mmol/L (ref 98–111)
Creatinine, Ser: 0.81 mg/dL (ref 0.44–1.00)
GFR, Estimated: >60 mL/min (ref >60)
Glucose, Bld: 122 mg/dL — ABNORMAL HIGH (ref 70–99)
Potassium: 3.8 mmol/L (ref 3.5–5.1)
Sodium: 139 mmol/L (ref 135–145)
Total Bilirubin: 0.5 mg/dL (ref 0.3–1.2)
Total Protein: 7.7 g/dL (ref 6.5–8.1)

## 2022-09-30 LAB — CBC
HCT: 29.8 % — ABNORMAL LOW (ref 36.0–46.0)
HCT: 37.6 % (ref 36.0–46.0)
Hemoglobin: 10.8 g/dL — ABNORMAL LOW (ref 12.0–15.0)
Hemoglobin: 13 g/dL (ref 12.0–15.0)
MCH: 32.3 pg (ref 26.0–34.0)
MCH: 33 pg (ref 26.0–34.0)
MCHC: 34.6 g/dL (ref 30.0–36.0)
MCHC: 36.2 g/dL — ABNORMAL HIGH (ref 30.0–36.0)
MCV: 91.1 fL (ref 80.0–100.0)
MCV: 93.3 fL (ref 80.0–100.0)
Platelets: 231 10*3/uL (ref 150–400)
Platelets: 239 10*3/uL (ref 150–400)
RBC: 3.27 MIL/uL — ABNORMAL LOW (ref 3.87–5.11)
RBC: 4.03 MIL/uL (ref 3.87–5.11)
RDW: 12.7 % (ref 11.5–15.5)
RDW: 12.9 % (ref 11.5–15.5)
WBC: 12.8 10*3/uL — ABNORMAL HIGH (ref 4.0–10.5)
WBC: 9.6 10*3/uL (ref 4.0–10.5)
nRBC: 0 % (ref 0.0–0.2)
nRBC: 0 % (ref 0.0–0.2)

## 2022-09-30 LAB — TYPE AND SCREEN
ABO/RH(D): B POS
Antibody Screen: NEGATIVE

## 2022-09-30 LAB — LACTATE DEHYDROGENASE: LDH: 181 U/L (ref 98–192)

## 2022-09-30 LAB — RPR: RPR Ser Ql: NONREACTIVE

## 2022-09-30 MED ORDER — ONDANSETRON HCL 4 MG/2ML IJ SOLN: 4.0000 mg | Freq: Four times a day (QID) | INTRAMUSCULAR | Status: DC | PRN

## 2022-09-30 MED ORDER — EPHEDRINE 5 MG/ML INJ: 10.0000 mg | INTRAVENOUS | Status: DC | PRN

## 2022-09-30 MED ORDER — FENTANYL CITRATE (PF) 100 MCG/2ML IJ SOLN: 50.0000 ug | INTRAMUSCULAR | Status: DC | PRN

## 2022-09-30 MED ORDER — PRENATAL MULTIVITAMIN CH
1.0000 | ORAL_TABLET | Freq: Every day | ORAL | Status: DC
  Administered 2022-09-30 – 2022-10-01 (×2): 1 via ORAL
  Filled 2022-09-30 (×2): qty 1

## 2022-09-30 MED ORDER — SIMETHICONE 80 MG PO CHEW: 80.0000 mg | CHEWABLE_TABLET | ORAL | Status: DC | PRN

## 2022-09-30 MED ORDER — METHYLERGONOVINE MALEATE 0.2 MG PO TABS: 0.2000 mg | ORAL_TABLET | ORAL | Status: DC | PRN

## 2022-09-30 MED ORDER — OXYCODONE-ACETAMINOPHEN 5-325 MG PO TABS: 1.0000 | ORAL_TABLET | ORAL | Status: DC | PRN

## 2022-09-30 MED ORDER — OXYCODONE-ACETAMINOPHEN 5-325 MG PO TABS: 2.0000 | ORAL_TABLET | ORAL | Status: DC | PRN

## 2022-09-30 MED ORDER — ZOLPIDEM TARTRATE 5 MG PO TABS: 5.0000 mg | ORAL_TABLET | Freq: Every evening | ORAL | Status: DC | PRN

## 2022-09-30 MED ORDER — LACTATED RINGERS IV SOLN: 500.0000 mL | Freq: Once | INTRAVENOUS | Status: DC

## 2022-09-30 MED ORDER — LACTATED RINGERS IV SOLN: INTRAVENOUS | Status: DC

## 2022-09-30 MED ORDER — FENTANYL-BUPIVACAINE-NACL 0.5-0.125-0.9 MG/250ML-% EP SOLN
12.0000 mL/h | EPIDURAL | Status: DC | PRN
  Administered 2022-09-30: 12 mL/h via EPIDURAL
  Filled 2022-09-30: qty 250

## 2022-09-30 MED ORDER — SENNOSIDES-DOCUSATE SODIUM 8.6-50 MG PO TABS
2.0000 | ORAL_TABLET | Freq: Every day | ORAL | Status: DC
  Administered 2022-09-30 – 2022-10-01 (×2): 2 via ORAL
  Filled 2022-09-30: qty 2

## 2022-09-30 MED ORDER — ACETAMINOPHEN 325 MG PO TABS: 650.0000 mg | ORAL_TABLET | ORAL | Status: DC | PRN

## 2022-09-30 MED ORDER — OXYCODONE HCL 5 MG PO TABS: 5.0000 mg | ORAL_TABLET | ORAL | Status: DC | PRN

## 2022-09-30 MED ORDER — WITCH HAZEL-GLYCERIN EX PADS: 1.0000 | MEDICATED_PAD | CUTANEOUS | Status: DC | PRN

## 2022-09-30 MED ORDER — HYDROXYZINE HCL 50 MG PO TABS: 50.0000 mg | ORAL_TABLET | Freq: Four times a day (QID) | ORAL | Status: DC | PRN

## 2022-09-30 MED ORDER — SENNOSIDES-DOCUSATE SODIUM 8.6-50 MG PO TABS
2.0000 | ORAL_TABLET | Freq: Every day | ORAL | Status: DC
  Filled 2022-09-30: qty 2

## 2022-09-30 MED ORDER — PHENYLEPHRINE 80 MCG/ML (10ML) SYRINGE FOR IV PUSH (FOR BLOOD PRESSURE SUPPORT)
80.0000 ug | PREFILLED_SYRINGE | INTRAVENOUS | Status: DC | PRN
  Filled 2022-09-30: qty 10

## 2022-09-30 MED ORDER — FAMOTIDINE 20 MG PO TABS
20.0000 mg | ORAL_TABLET | Freq: Two times a day (BID) | ORAL | Status: DC
  Administered 2022-09-30 – 2022-10-01 (×3): 20 mg via ORAL
  Filled 2022-09-30 (×3): qty 1

## 2022-09-30 MED ORDER — DIPHENHYDRAMINE HCL 50 MG/ML IJ SOLN: 12.5000 mg | INTRAMUSCULAR | Status: DC | PRN

## 2022-09-30 MED ORDER — SOD CITRATE-CITRIC ACID 500-334 MG/5ML PO SOLN: 30.0000 mL | ORAL | Status: DC | PRN

## 2022-09-30 MED ORDER — OXYTOCIN BOLUS FROM INFUSION
333.0000 mL | Freq: Once | INTRAVENOUS | Status: AC
  Administered 2022-09-30: 333 mL via INTRAVENOUS

## 2022-09-30 MED ORDER — DIBUCAINE (PERIANAL) 1 % EX OINT: 1.0000 | TOPICAL_OINTMENT | CUTANEOUS | Status: DC | PRN

## 2022-09-30 MED ORDER — IBUPROFEN 600 MG PO TABS
600.0000 mg | ORAL_TABLET | Freq: Four times a day (QID) | ORAL | Status: DC
  Administered 2022-09-30 – 2022-10-01 (×5): 600 mg via ORAL
  Filled 2022-09-30 (×5): qty 1

## 2022-09-30 MED ORDER — INFLUENZA VAC SPLIT QUAD 0.5 ML IM SUSY
0.5000 mL | PREFILLED_SYRINGE | INTRAMUSCULAR | Status: AC
  Administered 2022-10-01: 0.5 mL via INTRAMUSCULAR
  Filled 2022-09-30: qty 0.5

## 2022-09-30 MED ORDER — ONDANSETRON HCL 4 MG/2ML IJ SOLN: 4.0000 mg | INTRAMUSCULAR | Status: DC | PRN

## 2022-09-30 MED ORDER — OXYCODONE HCL 5 MG PO TABS: 10.0000 mg | ORAL_TABLET | ORAL | Status: DC | PRN

## 2022-09-30 MED ORDER — COCONUT OIL OIL: 1.0000 | TOPICAL_OIL | Status: DC | PRN

## 2022-09-30 MED ORDER — OXYTOCIN-SODIUM CHLORIDE 30-0.9 UT/500ML-% IV SOLN
2.5000 [IU]/h | INTRAVENOUS | Status: DC
  Administered 2022-09-30: 2.5 [IU]/h via INTRAVENOUS
  Filled 2022-09-30: qty 500

## 2022-09-30 MED ORDER — PHENYLEPHRINE 80 MCG/ML (10ML) SYRINGE FOR IV PUSH (FOR BLOOD PRESSURE SUPPORT): 80.0000 ug | PREFILLED_SYRINGE | INTRAVENOUS | Status: DC | PRN

## 2022-09-30 MED ORDER — LIDOCAINE HCL (PF) 1 % IJ SOLN
INTRAMUSCULAR | Status: DC | PRN
  Administered 2022-09-30: 10 mL via EPIDURAL

## 2022-09-30 MED ORDER — BENZOCAINE-MENTHOL 20-0.5 % EX AERO: 1.0000 | INHALATION_SPRAY | CUTANEOUS | Status: DC | PRN

## 2022-09-30 MED ORDER — ONDANSETRON HCL 4 MG PO TABS: 4.0000 mg | ORAL_TABLET | ORAL | Status: DC | PRN

## 2022-09-30 MED ORDER — METHYLERGONOVINE MALEATE 0.2 MG/ML IJ SOLN: 0.2000 mg | INTRAMUSCULAR | Status: DC | PRN

## 2022-09-30 MED ORDER — DIPHENHYDRAMINE HCL 25 MG PO CAPS: 25.0000 mg | ORAL_CAPSULE | Freq: Four times a day (QID) | ORAL | Status: DC | PRN

## 2022-09-30 MED ORDER — LIDOCAINE HCL (PF) 1 % IJ SOLN
30.0000 mL | INTRAMUSCULAR | Status: AC | PRN
  Administered 2022-09-30: 30 mL via SUBCUTANEOUS
  Filled 2022-09-30: qty 30

## 2022-09-30 NOTE — Plan of Care (Signed)
  Problem: Education: Goal: Knowledge of Childbirth will improve Outcome: Adequate for Discharge Goal: Ability to make informed decisions regarding treatment and plan of care will improve Outcome: Adequate for Discharge Goal: Ability to state and carry out methods to decrease the pain will improve Outcome: Adequate for Discharge Goal: Individualized Educational Video(s) Outcome: Not Applicable   Problem: Coping: Goal: Ability to verbalize concerns and feelings about labor and delivery will improve Outcome: Adequate for Discharge   Problem: Life Cycle: Goal: Ability to make normal progression through stages of labor will improve Outcome: Completed/Met Goal: Ability to effectively push during vaginal delivery will improve Outcome: Completed/Met   Problem: Role Relationship: Goal: Will demonstrate positive interactions with the child Outcome: Adequate for Discharge   Problem: Safety: Goal: Risk of complications during labor and delivery will decrease Outcome: Adequate for Discharge   Problem: Pain Management: Goal: Relief or control of pain from uterine contractions will improve Outcome: Adequate for Discharge

## 2022-09-30 NOTE — Lactation Note (Signed)
This note was copied from a baby's chart. Lactation Consultation Note  Patient Name: Girl [place] Austell Today's Date: 09/30/2022 Reason for consult: Term;1st time breastfeeding;Primapara;L&D Initial assessment Age:33 hours   Initial L&D Consult:  Visited with family < 1 hour after birth "Mallory" was able to latch, however, she only took a few sucks; very sleepy at this time.  Reassured parents that lactation services will be available on the M/B unit.     Maternal Data    Feeding Mother's Current Feeding Choice: Breast Milk  LATCH Score Latch: Repeated attempts needed to sustain latch, nipple held in mouth throughout feeding, stimulation needed to elicit sucking reflex.  Audible Swallowing: None  Type of Nipple: Everted at rest and after stimulation (Short shafted)  Comfort (Breast/Nipple): Soft / non-tender  Hold (Positioning): Assistance needed to correctly position infant at breast and maintain latch.  LATCH Score: 6   Lactation Tools Discussed/Used    Interventions Interventions: Skin to skin;Assisted with latch  Discharge    Consult Status Consult Status: Follow-up from L&D Follow-up type: In-patient    Alyse Kathan R June Rode 09/30/2022, 4:07 AM

## 2022-09-30 NOTE — Anesthesia Postprocedure Evaluation (Signed)
Anesthesia Post Note  Patient: Jamie Wells  Procedure(s) Performed: AN AD HOC LABOR EPIDURAL     Patient location during evaluation: Mother Baby Anesthesia Type: Epidural Level of consciousness: awake Pain management: satisfactory to patient Vital Signs Assessment: post-procedure vital signs reviewed and stable Respiratory status: spontaneous breathing Cardiovascular status: stable Anesthetic complications: no   No notable events documented.  Last Vitals:  Vitals:   09/30/22 0541 09/30/22 0940  BP: 125/80 109/75  Pulse: 84 95  Resp: 18 18  Temp: 37.2 C 37 C  SpO2: 100% 100%    Last Pain:  Vitals:   09/30/22 0940  TempSrc: Oral  PainSc: 0-No pain   Pain Goal:                   [employer]

## 2022-09-30 NOTE — Anesthesia Procedure Notes (Signed)
Epidural Patient location during procedure: OB Start time: 09/30/2022 1:06 AM End time: 09/30/2022 1:09 AM  Staffing Anesthesiologist: Leilani Able, MD Performed: anesthesiologist   Preanesthetic Checklist Completed: patient identified, IV checked, site marked, risks and benefits discussed, surgical consent, monitors and equipment checked, pre-op evaluation and timeout performed  Epidural Patient position: sitting Prep: DuraPrep and site prepped and draped Patient monitoring: continuous pulse ox and blood pressure Approach: midline Location: L3-L4 Injection technique: LOR air  Needle:  Needle type: Tuohy  Needle gauge: 17 G Needle length: 9 cm and 9 Needle insertion depth: 6 cm Catheter type: closed end flexible Catheter size: 19 Gauge Catheter at skin depth: 11 cm Test dose: negative and Other  Assessment Events: blood not aspirated, injection not painful, no injection resistance, no paresthesia and negative IV test  Additional Notes Reason for block:procedure for pain

## 2022-09-30 NOTE — Anesthesia Preprocedure Evaluation (Signed)
Anesthesia Evaluation  Patient identified by MRN, date of birth, ID band Patient awake    Reviewed: Allergy & Precautions, H&P , NPO status , Patient's Chart, lab work & pertinent test results  Airway Mallampati: I       Dental no notable dental hx.    Pulmonary neg pulmonary ROS   Pulmonary exam normal        Cardiovascular negative cardio ROS Normal cardiovascular exam     Neuro/Psych negative neurological ROS  negative psych ROS   GI/Hepatic negative GI ROS, Neg liver ROS,,,  Endo/Other  negative endocrine ROS    Renal/GU negative Renal ROS  negative genitourinary   Musculoskeletal negative musculoskeletal ROS (+)    Abdominal Normal abdominal exam  (+)   Peds  Hematology negative hematology ROS (+)   Anesthesia Other Findings   Reproductive/Obstetrics (+) Pregnancy                             Anesthesia Physical Anesthesia Plan  ASA: 2  Anesthesia Plan: Epidural   Post-op Pain Management:    Induction:   PONV Risk Score and Plan:   Airway Management Planned:   Additional Equipment:   Intra-op Plan:   Post-operative Plan:   Informed Consent: I have reviewed the patients History and Physical, chart, labs and discussed the procedure including the risks, benefits and alternatives for the proposed anesthesia with the patient or authorized representative who has indicated his/her understanding and acceptance.       Plan Discussed with:   Anesthesia Plan Comments:        Anesthesia Quick Evaluation  

## 2022-09-30 NOTE — Plan of Care (Signed)
  Problem: Education: Goal: Knowledge of Childbirth will improve Outcome: Progressing Goal: Ability to make informed decisions regarding treatment and plan of care will improve Outcome: Progressing Goal: Ability to state and carry out methods to decrease the pain will improve Outcome: Progressing   Problem: Coping: Goal: Ability to verbalize concerns and feelings about labor and delivery will improve Outcome: Progressing   Problem: Role Relationship: Goal: Will demonstrate positive interactions with the child Outcome: Progressing   Problem: Safety: Goal: Risk of complications during labor and delivery will decrease Outcome: Progressing   Problem: Pain Management: Goal: Relief or control of pain from uterine contractions will improve Outcome: Progressing   Problem: Education: Goal: Knowledge of General Education information will improve Description: Including pain rating scale, medication(s)/side effects and non-pharmacologic comfort measures Outcome: Progressing   Problem: Health Behavior/Discharge Planning: Goal: Ability to manage health-related needs will improve Outcome: Progressing   Problem: Clinical Measurements: Goal: Ability to maintain clinical measurements within normal limits will improve Outcome: Progressing Goal: Will remain free from infection Outcome: Progressing Goal: Diagnostic test results will improve Outcome: Progressing Goal: Respiratory complications will improve Outcome: Progressing Goal: Cardiovascular complication will be avoided Outcome: Progressing   Problem: Activity: Goal: Risk for activity intolerance will decrease Outcome: Progressing   Problem: Nutrition: Goal: Adequate nutrition will be maintained Outcome: Progressing   Problem: Coping: Goal: Level of anxiety will decrease Outcome: Progressing   Problem: Elimination: Goal: Will not experience complications related to bowel motility Outcome: Progressing Goal: Will not  experience complications related to urinary retention Outcome: Progressing   Problem: Pain Managment: Goal: General experience of comfort will improve Outcome: Progressing   Problem: Safety: Goal: Ability to remain free from injury will improve Outcome: Progressing   Problem: Skin Integrity: Goal: Risk for impaired skin integrity will decrease Outcome: Progressing   Problem: Education: Goal: Knowledge of condition will improve Outcome: Progressing Goal: Individualized Educational Video(s) Outcome: Progressing Goal: Individualized Newborn Educational Video(s) Outcome: Progressing   Problem: Activity: Goal: Will verbalize the importance of balancing activity with adequate rest periods Outcome: Progressing Goal: Ability to tolerate increased activity will improve Outcome: Progressing   Problem: Coping: Goal: Ability to identify and utilize available resources and services will improve Outcome: Progressing   Problem: Life Cycle: Goal: Chance of risk for complications during the postpartum period will decrease Outcome: Progressing   Problem: Role Relationship: Goal: Ability to demonstrate positive interaction with newborn will improve Outcome: Progressing   Problem: Skin Integrity: Goal: Demonstration of wound healing without infection will improve Outcome: Progressing

## 2022-09-30 NOTE — Lactation Note (Signed)
This note was copied from a baby's chart. Lactation Consultation Note  Patient Name: Jamie Wells Today's Date: 09/30/2022 Reason for consult: Initial assessment;Term;Primapara;1st time breastfeeding Age:33 hours   P1: Term infant at 40+0 weeks Feeding preference: Breast  "Mallory" was swaddled and asleep in the bassinet when I arrived.  Since it had been >3 hours since her last feeding I offered to awaken and assist with latching; mother receptive.  Taught hand expression; finger fed on colostrum drop.  Breast feeding basics reviewed.  Attempted to latch in the cross cradle hold, however, "Mallory" was not interested in opening her mouth to latch.  Reassurance given and placed her STS on mother's chest where she fell asleep.  Encouraged to feed 8-12 times/24 hours or sooner if baby shows cues.  Suggested she call her RN/LC for latch assistance as needed.  Grandmother present.   Maternal Data Has patient been taught Hand Expression?: Yes Does the patient have breastfeeding experience prior to this delivery?: No  Feeding Mother's Current Feeding Choice: Breast Milk  LATCH Score Latch: Too sleepy or reluctant, no latch achieved, no sucking elicited.  Audible Swallowing: None  Type of Nipple: Everted at rest and after stimulation  Comfort (Breast/Nipple): Soft / non-tender  Hold (Positioning): Assistance needed to correctly position infant at breast and maintain latch.  LATCH Score: 5   Lactation Tools Discussed/Used    Interventions Interventions: Breast feeding basics reviewed;Assisted with latch;Skin to skin;Breast massage;Hand express;Expressed milk;Position options;Support pillows;Adjust position;Education;LC Services brochure  Discharge Pump: Personal  Consult Status Consult Status: Follow-up Date: 10/01/22 Follow-up type: In-patient    Dora Sims 09/30/2022, 8:35 AM

## 2022-10-01 ENCOUNTER — Encounter (HOSPITAL_COMMUNITY): Payer: Self-pay | Admitting: Obstetrics and Gynecology

## 2022-10-01 MED ORDER — ACETAMINOPHEN 325 MG PO TABS: 650.0000 mg | ORAL_TABLET | Freq: Four times a day (QID) | ORAL | 1 refills | Status: DC | PRN

## 2022-10-01 MED ORDER — TETANUS-DIPHTH-ACELL PERTUSSIS 5-2.5-18.5 LF-MCG/0.5 IM SUSY
0.5000 mL | PREFILLED_SYRINGE | Freq: Once | INTRAMUSCULAR | Status: AC
  Administered 2022-10-01: 0.5 mL via INTRAMUSCULAR
  Filled 2022-10-01: qty 0.5

## 2022-10-01 MED ORDER — OXYCODONE HCL 5 MG PO TABS: 5.0000 mg | ORAL_TABLET | Freq: Four times a day (QID) | ORAL | 0 refills | Status: DC | PRN

## 2022-10-01 MED ORDER — IBUPROFEN 600 MG PO TABS: 600.0000 mg | ORAL_TABLET | Freq: Four times a day (QID) | ORAL | 1 refills | Status: DC | PRN

## 2022-10-01 NOTE — Lactation Note (Signed)
This note was copied from a baby's chart. Lactation Consultation Note  Patient Name: Jamie Wells Today's Date: 10/01/2022 Reason for consult: Follow-up assessment;Primapara;1st time breastfeeding;Term;Infant weight loss Age:33 hours  LC in to visit with P1 birth parent of a term baby at 6.7% weight loss.  Baby's output ok.  Mom denies any pain on latching.  Currently holding baby dressed and swaddled and baby sleepily sucking on a pacifier.  Baby has been exclusively breastfeeding.  LC shared the importance of holding off on pacifier use until baby has regained her birth weight and is latching easily.  Mom immediately removed pacifier.    Discussed baby's weight loss and asked if LC could assist/assess baby at the breast.  It had been 2 hrs since baby fed.  Talked about normal feeding patterns and clustering of feedings during these early days and during growth spurts, which often the pacifier masks these feeding cues.  No visible nipple trauma noted.  Mom is stating her breasts are feeling somewhat heavier today.  Reviewed hand expression and colostrum drop expressed.  Baby positioned in football hold on left breast.  Pillow support provided explaining the importance of this.  Baby latching onto Mom's nipple.  Watched how baby was not sucking with deep jaw extensions, no pain felt and no swallowing.  When baby taken off the breast, nipple was not pinched.  Tried again in football hold and baby unable to sustain a deep latch.   Assisted with cross cradle hold on right breast.  Educated and assisted with a U hold of the breast and supporting baby's head from ear to ear.  Baby opened her mouth widely and latched deeply.  Showed Mom how to tug on chin to flange lower lip prn.  Baby's lips were flanged and baby noted to be sucking with a deeper and slower suck pattern, swallows identified.  Mom taught to use alternate breast compression to increase milk transfer at the breast.  NP  recommended Neosure formula after breast feeding.  Mom will double pump if formula is needed.  If able to express EBM, Mom knows to use this first.  Engorgement prevention and treatment reviewed. Recommended OP lactation f/u and Mom is agreeable, message sent to clinic.  Feeding Mother's Current Feeding Choice: Breast Milk  LATCH Score Latch: Grasps breast easily, tongue down, lips flanged, rhythmical sucking.  Audible Swallowing: A few with stimulation  Type of Nipple: Everted at rest and after stimulation  Comfort (Breast/Nipple): Soft / non-tender  Hold (Positioning): Assistance needed to correctly position infant at breast and maintain latch.  LATCH Score: 8   Interventions Interventions: Breast feeding basics reviewed;Assisted with latch;Skin to skin;Breast massage;Hand express;Breast compression;Adjust position;Support pillows;Position options;Expressed milk;Education  Discharge Discharge Education: Engorgement and breast care;Warning signs for feeding baby;Outpatient recommendation;Outpatient Epic message sent Pump: Personal;DEBP  Consult Status Consult Status: Complete Date: 10/01/22 Follow-up type: Out-patient    [name] 10/01/2022, 10:53 AM

## 2022-10-01 NOTE — Discharge Summary (Signed)
Physician Discharge Summary  Patient ID: Jamie Wells MRN: [id number] DOB/AGE: [birth year] 31 y.o.  Admit date: 09/29/2022 Discharge date: 10/01/2022  Admission Diagnoses: Labor  Discharge Diagnoses:  Principal Problem:   Normal labor   Discharged Condition: good  Hospital Course: Uneventful PP course.  Baby girl.  Discharged on PPD 1.  Technically met criteria for GHTN during labor but w/u negative and BPs wnl PP and no sxs.  Consults: None  Significant Diagnostic Studies: labs: Hgb 10.8  Treatments: IV hydration and NSVD  Discharge Exam: Blood pressure 96/68, pulse 85, temperature 98 F (36.7 C), temperature source Oral, resp. rate 16, height 5' 3" (1.6 m), weight 73 kg, last menstrual period 12/24/2021, SpO2 100 %, unknown if currently breastfeeding. General appearance: alert and no distress Resp: clear to auscultation bilaterally Cardio: regular rate and rhythm Extremities: no calf tenderness FF, NT Lochia wnl  Disposition: Discharge disposition: 01-Home or Self Care        Allergies as of 10/01/2022   No Known Allergies      Medication List     TAKE these medications    acetaminophen 325 MG tablet Commonly known as: Tylenol Take 2 tablets (650 mg total) by mouth every 6 (six) hours as needed for mild pain or moderate pain (for pain scale < 4).   CITRANATAL BLOOM PO Take by mouth.   famotidine 20 MG tablet Commonly known as: PEPCID Take 1 tablet (20 mg total) by mouth 2 (two) times daily.   fluticasone 50 MCG/ACT nasal spray Commonly known as: FLONASE Place 2 sprays into both nostrils daily.   ibuprofen 600 MG tablet Commonly known as: ADVIL Take 1 tablet (600 mg total) by mouth every 6 (six) hours as needed for mild pain, moderate pain or cramping.   oxyCODONE 5 MG immediate release tablet Commonly known as: Oxy IR/ROXICODONE Take 1 tablet (5 mg total) by mouth every 6 (six) hours as needed for moderate pain, severe pain or  breakthrough pain (pain scale 4-7).        Follow-up [facility]. Schedule an appointment as soon as possible for a visit in 5 week(s).   Specialty: Obstetrics and Gynecology Why: For Postpartum follow-up Contact information: [address] [place] [id number] [phone]                Signed: Delice Lesch 10/01/2022, 12:53 PM

## 2022-10-08 ENCOUNTER — Telehealth (HOSPITAL_COMMUNITY): Payer: Self-pay | Admitting: *Deleted

## 2022-10-08 NOTE — Telephone Encounter (Signed)
Attempted hospital discharge follow-up call. Left message for patient to return RN call with any questions or concerns. Deforest Hoyles, RN, 10/08/22, [phone]

## 2022-11-27 ENCOUNTER — Encounter: Payer: Self-pay | Admitting: Student

## 2022-11-27 ENCOUNTER — Ambulatory Visit: Payer: 59 | Admitting: Student

## 2022-11-27 VITALS — BP 120/76 | HR 94 | Temp 99.0°F | Ht 63.0 in | Wt 144.8 lb

## 2022-11-27 DIAGNOSIS — H6690 Otitis media, unspecified, unspecified ear: Secondary | ICD-10-CM | POA: Insufficient documentation

## 2022-11-27 DIAGNOSIS — J019 Acute sinusitis, unspecified: Secondary | ICD-10-CM | POA: Insufficient documentation

## 2022-11-27 MED ORDER — AMOXICILLIN-POT CLAVULANATE 875-125 MG PO TABS: 1.0000 | ORAL_TABLET | Freq: Two times a day (BID) | ORAL | 0 refills | Status: AC

## 2022-11-27 MED ORDER — FLUTICASONE PROPIONATE 50 MCG/ACT NA SUSP: 2.0000 | Freq: Every day | NASAL | 6 refills | Status: DC

## 2022-11-27 NOTE — Assessment & Plan Note (Signed)
Left TM retracted and opaque-as we will be treating for sinusitis, will treat for otitis media.  Differential includes: Sinus infection, chronic allergies, perforated tympanic membrane. -Follow-up if not improved or develops hearing impairment

## 2022-11-27 NOTE — Progress Notes (Signed)
    SUBJECTIVE:   CHIEF COMPLAINT / HPI:   Sinus pain Started 2 weeks ago, having sinus pressure and pain. Headaches. Taking pseudofed (whole 2 weeks) and tylenol. Nose-spray, dose know the medication. No cough, sore-throat, fever. Rhinorrhea, thick and yellow. Step-son was sick but recovered within 1 week. No hx of sinus infections.  OBJECTIVE:   BP 120/76   Pulse 94   Temp 99 F (37.2 C)   Ht 5\' 3"  (1.6 m)   Wt 144 lb 12.8 oz (65.7 kg)   LMP 12/24/2021   SpO2 100%   BMI 25.65 kg/m    General: NAD, pleasant HEENT: Normocephalic, atraumatic head.  Pain with palpation over frontal and maxillary sinus.  Retracted and opaque left TM, right canal and TM normal. Hearing intact bilaterally.  EOM intact and normal conjunctiva BL. Normal external nose.  Erythematous and edematous nasal turbinates, with rhinorrhea. throat: Postnasal drip.  No exudate, no deviation. Normal dentition.  Cardio: RRR, no MRG. Cap Refill <2s. Respiratory: CTAB, normal wob on RA Skin: Warm and dry  ASSESSMENT/PLAN:   Acute otitis media Left TM retracted and opaque-as we will be treating for sinusitis, will treat for otitis media.  Differential includes: Sinus infection, chronic allergies, perforated tympanic membrane. -Follow-up if not improved or develops hearing impairment  Acute non-recurrent sinusitis Onset greater than 2 weeks, not improving.  On exam, pain with palpation over sinuses.  Due to length of symptoms, will treat with antibiotics.  Return precautions, symptomatic care and plan discussed-patient expressed understanding agreement with plan. -Augmentin 875-125 mg twice daily for 5 days -Flonase twice daily   Jamie Wells, [place]

## 2022-11-27 NOTE — Patient Instructions (Signed)
It was great to see you! Thank you for allowing me to participate in your care!   I recommend that you always bring your medications to each appointment as this makes it easy to ensure we are on the correct medications and helps [place] not miss when refills are needed.  Our plans for today:  -Please take 1 tablet of amoxicillin-clavulanate (Augmentin) 2 times daily for 5 days -Please use Flonase, 2 sprays in each nostril twice daily for 5 days or until improved -Continue to take ibuprofen and Tylenol for pain -Follow-up in 1 week if not improved   Take care and seek immediate care sooner if you develop any concerns. Please remember to show up 15 minutes before your scheduled appointment time!  Leslie Dales, DO [facility]

## 2022-11-27 NOTE — Assessment & Plan Note (Signed)
Onset greater than 2 weeks, not improving.  On exam, pain with palpation over sinuses.  Due to length of symptoms, will treat with antibiotics.  Return precautions, symptomatic care and plan discussed-patient expressed understanding agreement with plan. -Augmentin 875-125 mg twice daily for 5 days -Flonase twice daily

## 2022-12-04 ENCOUNTER — Encounter: Payer: Self-pay | Admitting: Family Medicine

## 2022-12-04 NOTE — Telephone Encounter (Signed)
Please review.  KP

## 2023-09-18 IMAGING — US US OB < 14 WEEKS - US OB TV
1 series · 15 of 28 positions shown · non-contrast
Comparison: None.

CLINICAL DATA: Pregnant with abdominal pain on the left.

EXAM:
OBSTETRIC <14 WK US AND TRANSVAGINAL OB US
TECHNIQUE: Both transabdominal and transvaginal ultrasound examinations were
performed for complete evaluation of the gestation as well as the
maternal uterus, adnexal regions, and pelvic cul-de-sac.
Transvaginal technique was performed to assess early pregnancy.

[Series 1: us ob < 14 weeks - us ob tv · 15 of 44 slices shown]
[im 1/44]
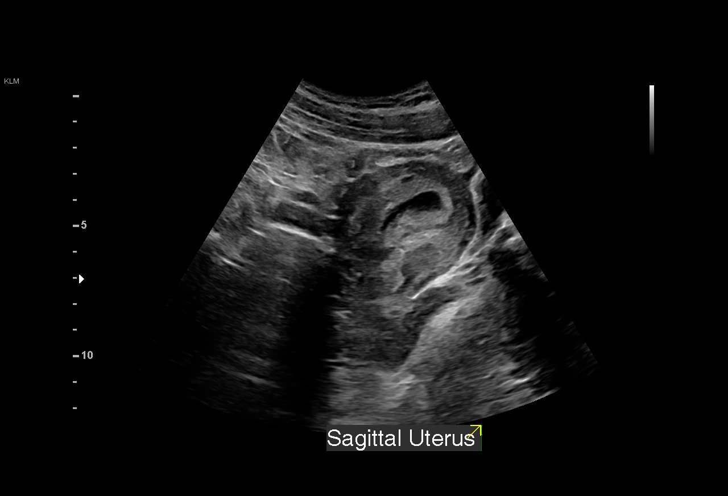
[im 4/44]
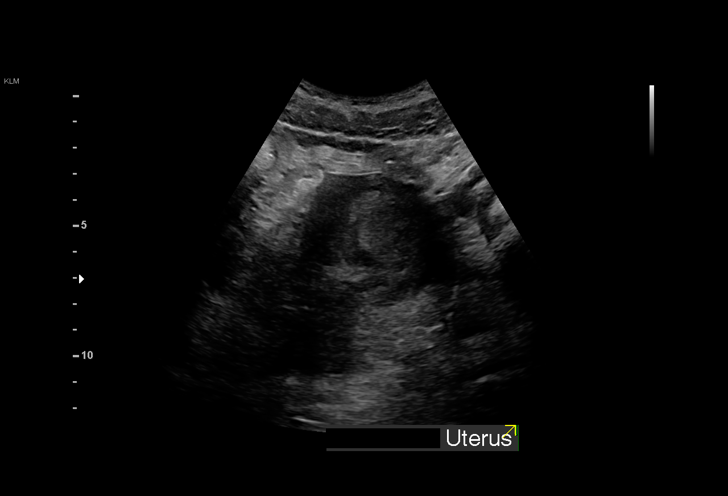
[im 7/44]
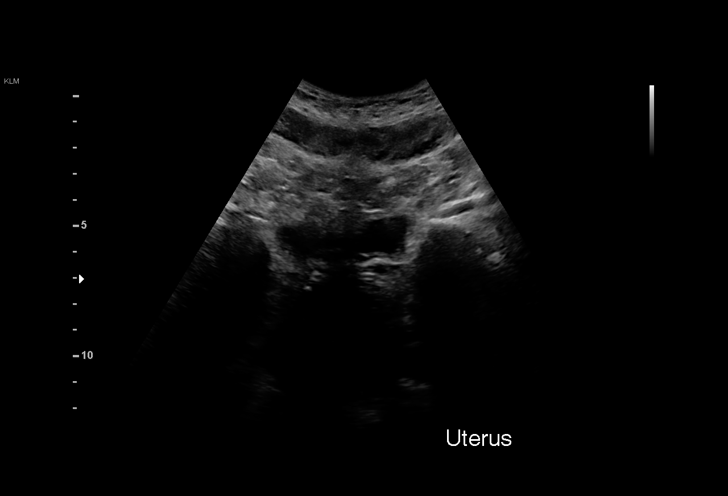
[im 10/44]
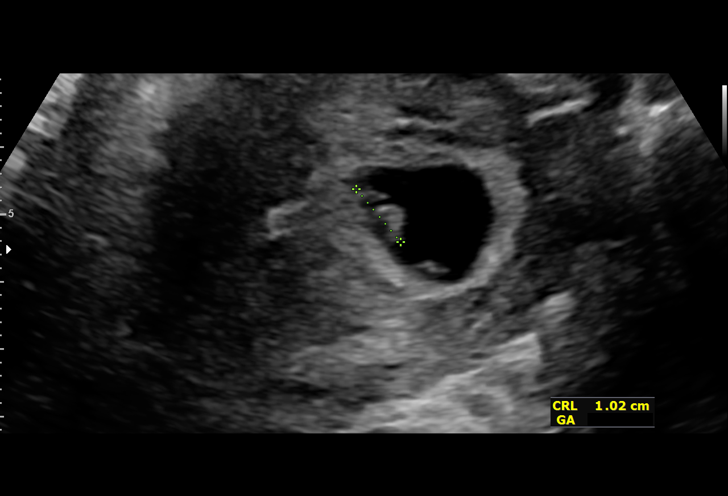
[im 13/44]
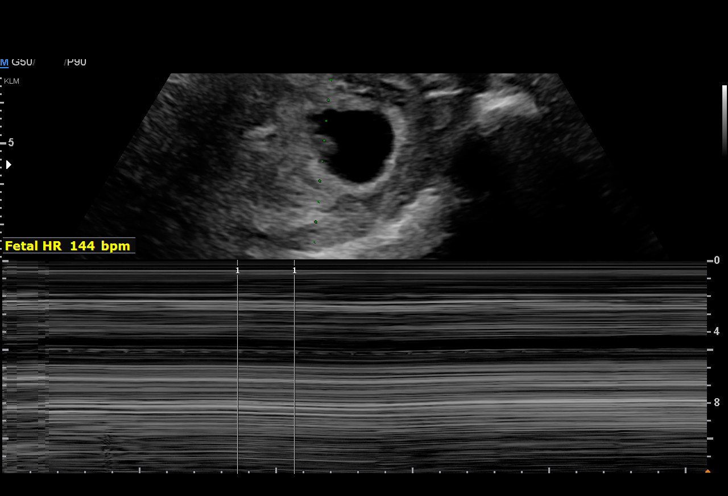
[im 16/44]
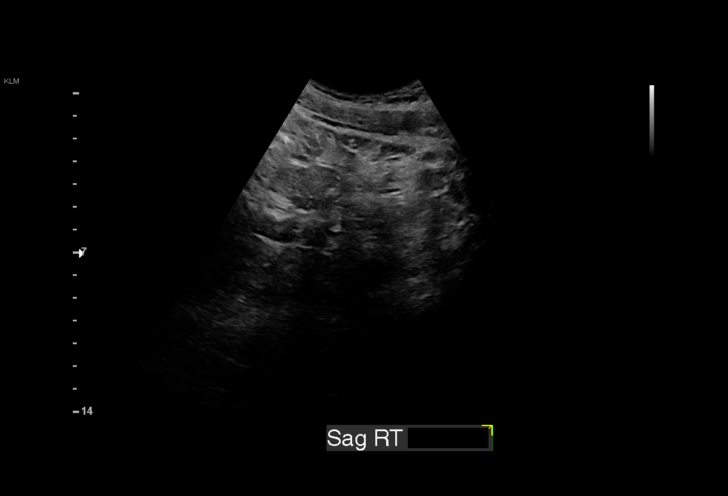
[im 20/44]
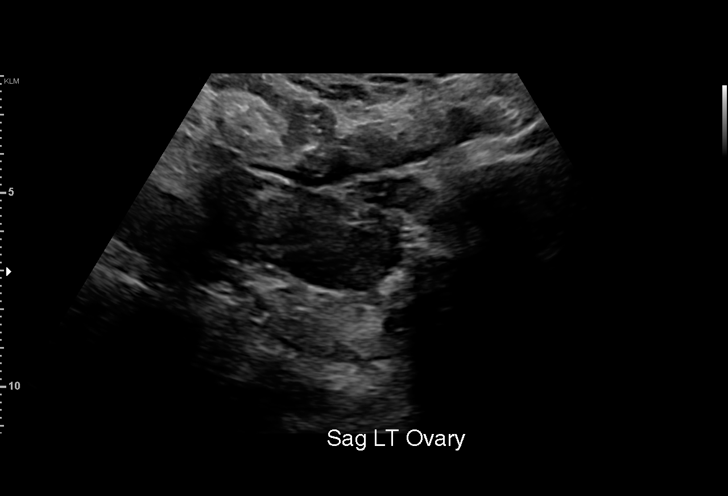
[im 23/44]
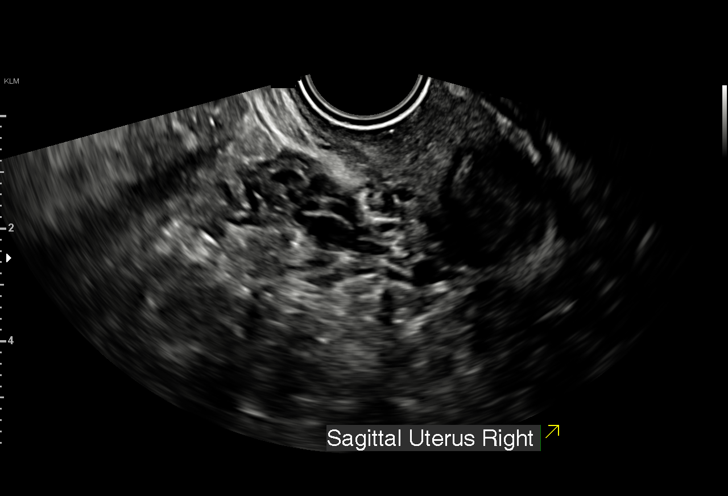
[im 24/44]
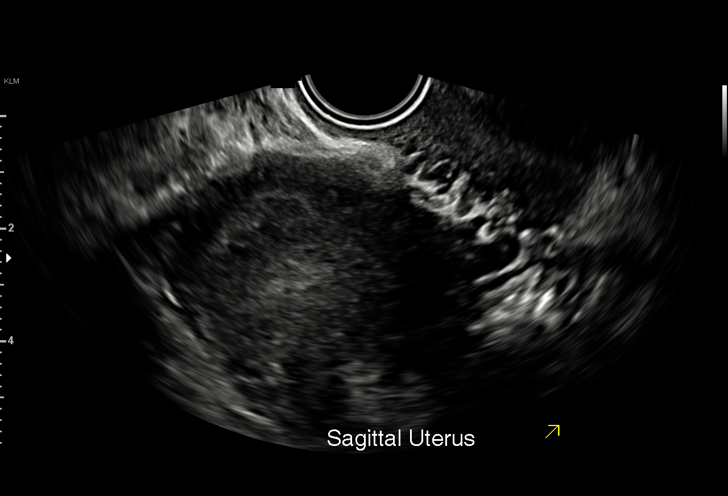
[im 28/44]
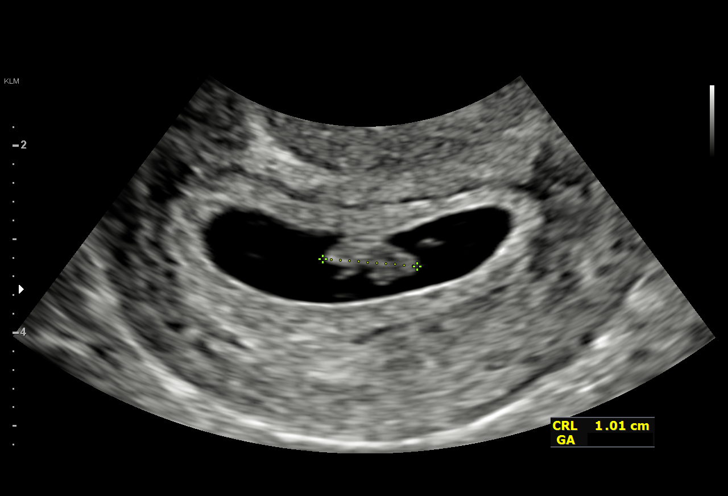
[im 31/44]
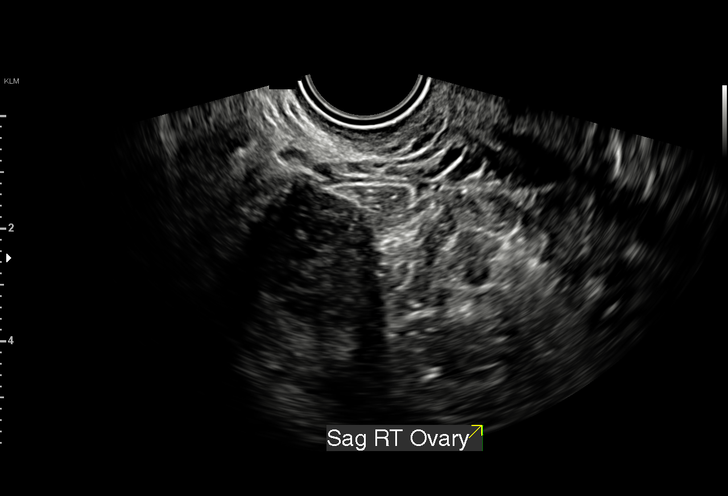
[im 34/44]
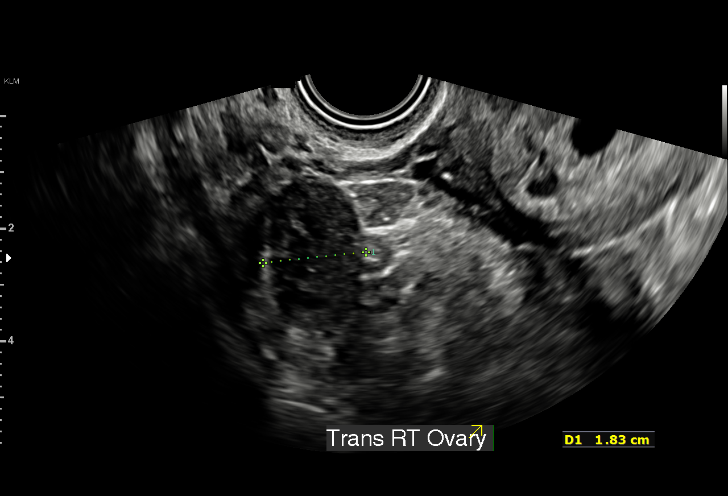
[im 37/44]
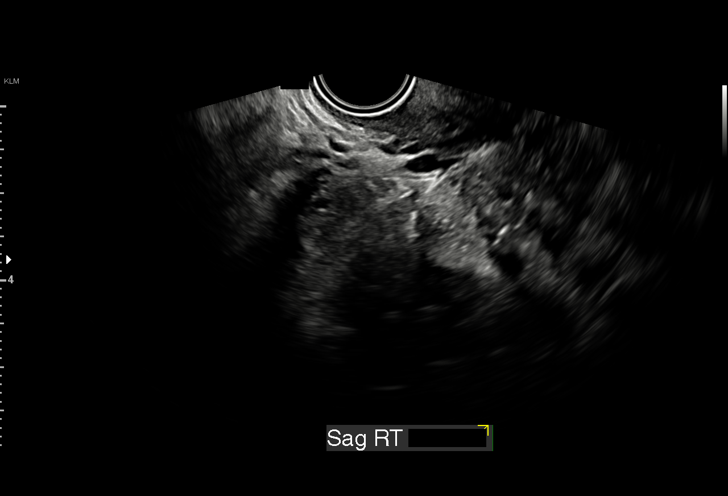
[im 40/44]
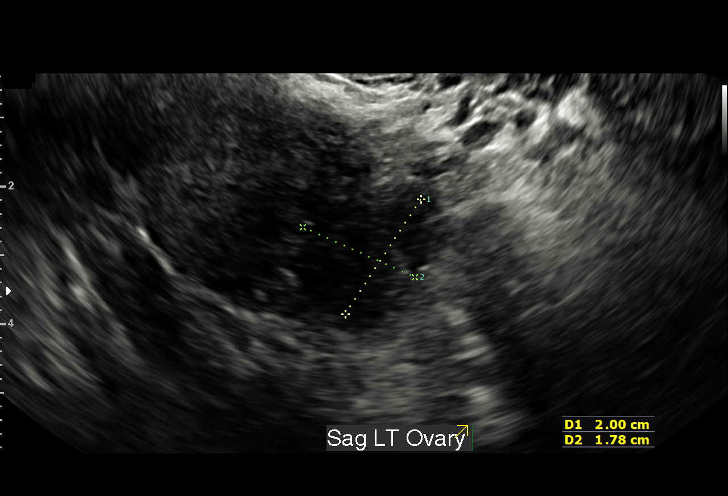
[im 44/44]
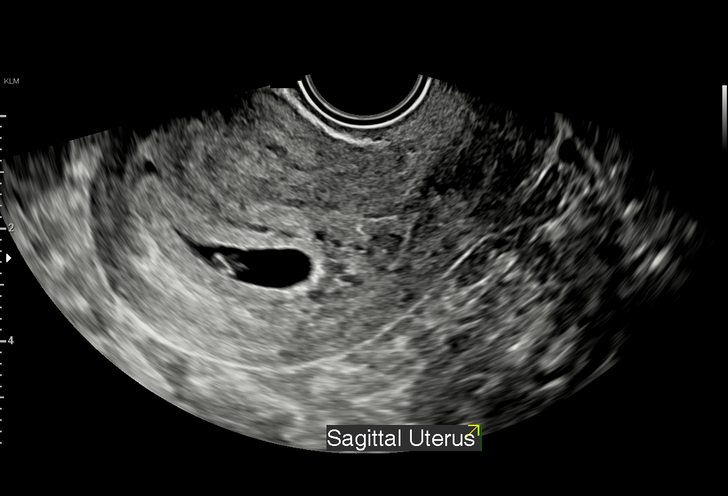

[15 of 28 positions shown; findings below may reference images not displayed]

FINDINGS: Intrauterine gestational sac: Single

Yolk sac:  Visualized.

Embryo:  Visualized.

Placenta: Appears to be developing at the fundal/anterior aspect but
difficult to localize at this early stage.

Amniotic fluid: Visually normal.

Cardiac Activity: Visualized.

Heart Rate: 144 bpm

MSD: Fetal pole visualized, not measured.

CRL:  10.5 mm   7 w   1 d                  US EDC: 09/30/2022

Subchorionic hemorrhage:  None visualized.

Maternal uterus/adnexae: The uterus is anteverted. The cervix
appears closed. No uterine wall mass is seen. The ovaries are
sonographically unremarkable and normal in size but were not
interrogated for Doppler flow. There is no free fluid. No adnexal
mass is seen.
IMPRESSION: 1. Living single IUP at 7 weeks 1 day, EDC 09/30/2022.
2. No subchorionic hemorrhage or acute sonographic findings.
3. Anatomic fetal survey suggested 18-20 weeks' gestation.

## 2023-10-24 ENCOUNTER — Encounter: Payer: Self-pay | Admitting: Family Medicine

## 2023-10-24 ENCOUNTER — Encounter: Payer: 59 | Admitting: Student

## 2023-10-24 ENCOUNTER — Ambulatory Visit: Payer: 59 | Admitting: Family Medicine

## 2023-10-24 ENCOUNTER — Other Ambulatory Visit (HOSPITAL_COMMUNITY)
Admission: RE | Admit: 2023-10-24 | Discharge: 2023-10-24 | Disposition: A | Discharge status: Home / Self Care | Payer: 59 | Source: Ambulatory Visit | Attending: Family Medicine | Admitting: Family Medicine

## 2023-10-24 VITALS — BP 131/85 | HR 94 | Ht 63.0 in | Wt 151.4 lb

## 2023-10-24 DIAGNOSIS — R748 Abnormal levels of other serum enzymes: Secondary | ICD-10-CM

## 2023-10-24 DIAGNOSIS — Z124 Encounter for screening for malignant neoplasm of cervix: Secondary | ICD-10-CM | POA: Insufficient documentation

## 2023-10-24 DIAGNOSIS — Z23 Encounter for immunization: Secondary | ICD-10-CM

## 2023-10-24 DIAGNOSIS — D649 Anemia, unspecified: Secondary | ICD-10-CM | POA: Diagnosis not present

## 2023-10-24 NOTE — Assessment & Plan Note (Addendum)
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 Recommended repeat Pap smear in 1 year with reflex testing, overdue.  - Pap smear performed today with STD testing

## 2023-10-24 NOTE — Assessment & Plan Note (Addendum)
Lab work with low hemoglobin but normal MCV in the past. Ferritin normal in the past. Differential for anemia includes IDA, Vitam B12 or folate deficiency anemia vs aplastic anemia vs colon cancer. - Recheck CBC and ferritin today

## 2023-10-24 NOTE — Patient Instructions (Addendum)
It was great to see you today! Thank you for choosing [facility] for your primary care. Jamie Wells was seen for annual physical.  Today we addressed: Pap smear - history of positive pap and normal colposcopy. Routine follow up today. Flu and COVID shots today Labs - CBC and CMP to check for anemia, any electrolyte or liver abnormalities with your history of anemia, low potassium and elevated alkaline phosphatase  If you haven't already, sign up for My Chart to have easy access to your labs results, and communication with your primary care physician.  We are checking some labs today. If they are abnormal, I will call you. If they are normal, I will send you a MyChart message (if it is active) or a letter in the mail. If you do not hear about your labs in the next 2 weeks, please call the office.  You should return to our clinic Return in about 1 year (around 10/23/2024) for or as needed. Please arrive 15 minutes before your appointment to ensure smooth check in process.  We appreciate your efforts in making this happen.  Thank you for allowing me to participate in your care, Fortunato Curling, DO 10/24/2023, 10:18 AM PGY-1, [facility]

## 2023-10-24 NOTE — Progress Notes (Signed)
         SUBJECTIVE:   Chief compliant/HPI: annual examination  Jamie Wells is a 32 y.o. who presents today for an annual exam. She has no concerns today.  She is due for her pap smear today and would like to get her COVID and Flu shot today.  OBJECTIVE:   BP 131/85   Pulse 94   Ht 5\' 3"  (1.6 m)   Wt 151 lb 6.4 oz (68.7 kg)   SpO2 99%   BMI 26.82 kg/m   General: Awake and Alert in NAD HEENT: NCAT. Sclera anicteric. No rhinorrhea.  Cardiovascular: RRR. No M/R/G Respiratory: CTAB, normal WOB on RA. No wheezing, crackles, rhonchi, or diminished breath sounds. Abdomen: Soft, non-tender, non-distended. Bowel sounds normoactive Extremities: Able to move all extremities equally. No BLE edema, no deformities or significant joint findings. F GU: Normally developed genitalia with no external lesions or eruptions. Vagina and cervix show no lesions, inflammation, discharge or tenderness. No cystocele. Bimanual exam reveals normal sized uterus no masses appreciated, no pain with examination. Chaperoned by CMA Misty Stanley.  Skin: Warm and dry. No abrasions or rashes noted. Neuro: A&Ox3. No focal neurological deficits.  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 Recommended repeat Pap smear in 1 year with reflex testing, overdue.  - Pap smear performed today with STD testing Anemia, unspecified type Lab work with low hemoglobin but normal MCV in the past. Ferritin normal in the past. Differential for anemia includes IDA, Vitam B12 or folate deficiency anemia vs aplastic anemia vs colon cancer. - Recheck CBC and ferritin today Elevated alkaline phosphatase level Mildly isolated elevated alk phos in 2023 to 127. Differential includes cholestatic liver diseases (PBC vs PSC) vs hyperparathyroidism vs bone disease vs HCC. No pain over RUQ today. - Recheck CMP  today  Annual Examination  PHQ score 0, reviewed and discussed. Blood pressure reviewed and at goal.  Asked about intimate partner violence and patient reports none.   Considered the following items based upon USPSTF recommendations: HIV testing:  done previously Hepatitis C:  done previously Hepatitis B:  done previously GC/CT  ordered with pap.  Discussed family history, BRCA testing not indicated. Cervical cancer screening: due for Pap today, cytology + HPV ordered Immunizations: Flu and COVID shot today  Follow up in 1 year or sooner if indicated.   Fortunato Curling, DO  [facility]

## 2023-10-25 LAB — COMPREHENSIVE METABOLIC PANEL
ALT: 6 [IU]/L (ref 0–32)
AST: 20 [IU]/L (ref 0–40)
Albumin: 4.6 g/dL (ref 3.9–4.9)
Alkaline Phosphatase: 51 [IU]/L (ref 44–121)
BUN/Creatinine Ratio: 9 (ref 9–23)
BUN: 8 mg/dL (ref 6–20)
Bilirubin Total: 0.5 mg/dL (ref 0.0–1.2)
CO2: 21 mmol/L (ref 20–29)
Calcium: 9.8 mg/dL (ref 8.7–10.2)
Chloride: 102 mmol/L (ref 96–106)
Creatinine, Ser: 0.85 mg/dL (ref 0.57–1.00)
Globulin, Total: 3.1 g/dL (ref 1.5–4.5)
Glucose: 83 mg/dL (ref 70–99)
Potassium: 4.4 mmol/L (ref 3.5–5.2)
Sodium: 139 mmol/L (ref 134–144)
Total Protein: 7.7 g/dL (ref 6.0–8.5)
eGFR: 92 mL/min/{1.73_m2} (ref >59)

## 2023-10-25 LAB — CBC
Hematocrit: 36.6 % (ref 34.0–46.6)
Hemoglobin: 12.2 g/dL (ref 11.1–15.9)
MCH: 31.9 pg (ref 26.6–33.0)
MCHC: 33.3 g/dL (ref 31.5–35.7)
MCV: 96 fL (ref 79–97)
Platelets: 347 10*3/uL (ref 150–450)
RBC: 3.82 x10E6/uL (ref 3.77–5.28)
RDW: 12.1 % (ref 11.7–15.4)
WBC: 5.1 10*3/uL (ref 3.4–10.8)

## 2023-10-25 LAB — FERRITIN: Ferritin: 41 ng/mL (ref 15–150)

## 2023-10-30 LAB — CYTOLOGY - PAP
Chlamydia: NEGATIVE
Comment: NEGATIVE
Comment: NEGATIVE
Comment: NEGATIVE
Comment: NORMAL
Diagnosis: NEGATIVE
High risk HPV: NEGATIVE
Neisseria Gonorrhea: NEGATIVE
Trichomonas: NEGATIVE

## 2023-12-09 ENCOUNTER — Ambulatory Visit: Payer: Managed Care, Other (non HMO) | Admitting: Student

## 2023-12-09 ENCOUNTER — Encounter: Payer: Self-pay | Admitting: Student

## 2023-12-09 VITALS — BP 112/70 | HR 70 | Temp 98.5°F | Ht 63.0 in | Wt 150.8 lb

## 2023-12-09 DIAGNOSIS — N926 Irregular menstruation, unspecified: Secondary | ICD-10-CM

## 2023-12-09 LAB — POCT URINE PREGNANCY: Preg Test, Ur: POSITIVE — AB

## 2023-12-09 NOTE — Progress Notes (Signed)
  SUBJECTIVE:   CHIEF COMPLAINT / HPI:   G1 P1001 presenting with father baby for positive home pregnancy test 11/28/23.  They are excited about this news.  She was taking prenatal vitamin prior to becoming pregnant.  States LMP 10/27/2023.  They are interested in genetic screening in addition to knowing gender of baby. Denies vaginal bleeding, loss of fluid. No complications of last pregnancy with the exception of heartburn and morning sickness.   PERTINENT  PMH / PSH: Eczema  OBJECTIVE:  BP 112/70   Pulse 70   Temp 98.5 F (36.9 C)   Ht 5\' 3"  (1.6 m)   Wt 150 lb 12.8 oz (68.4 kg)   LMP 10/27/2023 (Exact Date)   SpO2 99%   Breastfeeding No   BMI 26.71 kg/m  General: Well-appearing, NAD CV: RRR, no murmurs auscultated Pulm: CTAB, normal WOB Abdomen: Soft, nontender, normoactive bowel sounds  ASSESSMENT/PLAN:   Assessment & Plan Missed menses Positive pregnancy test, will proceed with obtaining initial OB labs.  Discussed EDD 08/02/2024 and today she is [redacted]w[redacted]d.  No current red flags.  Appropriately taking prenatal multivitamin. Return in about 1 month (around 01/09/2024) for Initial OB. Shelby Mattocks, DO 12/09/2023, 2:45 PM PGY-3, [facility]

## 2023-12-09 NOTE — Patient Instructions (Addendum)
It was great to see you today! Thank you for choosing [facility] for your primary care.  Today we addressed: Congratulations on your pregnancy.  We are going to get prenatal lab work today.  You are due date is 08/02/2024.  We will need to see you in 1 month for your initial OB visit which will be an hour-long appointment.  I will get my office to call and help you schedule this.  Please continue your prenatal vitamin.  I have attached a list of pregnancy safe medications below.  If you haven't already, sign up for My Chart to have easy access to your labs results, and communication with your primary care physician.  Return in about 1 month (around 01/09/2024) for Initial OB. Please arrive 15 minutes before your appointment to ensure smooth check in process.  We appreciate your efforts in making this happen.  Thank you for allowing me to participate in your care, Jamie Mattocks, DO 12/09/2023, 2:44 PM PGY-3, [facility]   Safe Medications in Pregnancy   Acne:  Benzoyl Peroxide  Salicylic Acid   Backache/Headache:  Tylenol: 2 regular strength every 4 hours OR               2 Extra strength every 6 hours   Colds/Coughs/Allergies:  Benadryl (alcohol free) 25 mg every 6 hours as needed  Breath right strips  Claritin  Cepacol throat lozenges  Chloraseptic throat spray  Cold-Eeze- up to three times per day  Cough drops, alcohol free  Flonase (by prescription only)  Guaifenesin  Mucinex  Robitussin DM (plain only, alcohol free)  Saline nasal spray/drops  Sudafed (pseudoephedrine) & Actifed * use only after [redacted] weeks gestation and if you do not have high blood pressure  Tylenol  Vicks Vaporub  Zinc lozenges  Zyrtec   Constipation:  Colace  Ducolax suppositories  Fleet enema  Glycerin suppositories  Metamucil  Milk of magnesia  Miralax  Senokot  Smooth move tea   Diarrhea:  Kaopectate  Imodium A-D   *NO pepto Bismol   Hemorrhoids:  Anusol   Anusol HC  Preparation H  Tucks   Indigestion:  Tums  Maalox  Mylanta  Zantac  Pepcid   Insomnia:  Benadryl (alcohol free) 25mg  every 6 hours as needed  Tylenol PM  Unisom, no Gelcaps   Leg Cramps:  Tums  MagGel   Nausea/Vomiting:  Bonine  Dramamine  Emetrol  Ginger extract  Sea bands  Meclizine  Nausea medication to take during pregnancy:  Unisom (doxylamine succinate 25 mg tablets) Take one tablet daily at bedtime. If symptoms are not adequately controlled, the dose can be increased to a maximum recommended dose of two tablets daily (1/2 tablet in the morning, 1/2 tablet mid-afternoon and one at bedtime).  Vitamin B6 100mg  tablets. Take one tablet twice a day (up to 200 mg per day).   Skin Rashes:  Aveeno products  Benadryl cream or 25mg  every 6 hours as needed  Calamine Lotion  1% cortisone cream   Yeast infection:  Gyne-lotrimin 7  Monistat 7    **If taking multiple medications, please check labels to avoid duplicating the same active ingredients  **take medication as directed on the label  ** Do not exceed 4000 mg of tylenol in 24 hours  **Do not take medications that contain aspirin or ibuprofen

## 2023-12-11 ENCOUNTER — Encounter: Payer: Self-pay | Admitting: Student

## 2023-12-11 LAB — CBC/D/PLT+RPR+RH+ABO+RUBIGG...
Antibody Screen: NEGATIVE
Basophils Absolute: 0 10*3/uL (ref 0.0–0.2)
Basos: 0 %
Bilirubin, UA: NEGATIVE
EOS (ABSOLUTE): 0.1 10*3/uL (ref 0.0–0.4)
Eos: 1 %
Glucose, UA: NEGATIVE
HCV Ab: NONREACTIVE
HIV Screen 4th Generation wRfx: NONREACTIVE
Hematocrit: 38.1 % (ref 34.0–46.6)
Hemoglobin: 12.7 g/dL (ref 11.1–15.9)
Hepatitis B Surface Ag: NEGATIVE
Immature Grans (Abs): 0 10*3/uL (ref 0.0–0.1)
Immature Granulocytes: 0 %
Ketones, UA: NEGATIVE
Lymphocytes Absolute: 2.3 10*3/uL (ref 0.7–3.1)
Lymphs: 33 %
MCH: 32.4 pg (ref 26.6–33.0)
MCHC: 33.3 g/dL (ref 31.5–35.7)
MCV: 97 fL (ref 79–97)
Monocytes Absolute: 0.3 10*3/uL (ref 0.1–0.9)
Monocytes: 4 %
Neutrophils Absolute: 4.2 10*3/uL (ref 1.4–7.0)
Neutrophils: 62 %
Nitrite, UA: NEGATIVE
Platelets: 309 10*3/uL (ref 150–450)
RBC, UA: NEGATIVE
RBC: 3.92 x10E6/uL (ref 3.77–5.28)
RDW: 12.3 % (ref 11.7–15.4)
RPR Ser Ql: NONREACTIVE
Rh Factor: POSITIVE
Rubella Antibodies, IGG: 3.06 {index} (ref >0.99)
Specific Gravity, UA: 1.022 (ref 1.005–1.030)
Urobilinogen, Ur: 0.2 mg/dL (ref 0.2–1.0)
WBC: 6.9 10*3/uL (ref 3.4–10.8)
pH, UA: 7 (ref 5.0–7.5)

## 2023-12-11 LAB — MICROSCOPIC EXAMINATION: Casts: NONE SEEN /[LPF]

## 2023-12-11 LAB — URINE CULTURE, OB REFLEX

## 2023-12-11 LAB — HCV INTERPRETATION

## 2023-12-15 ENCOUNTER — Encounter: Payer: Managed Care, Other (non HMO) | Admitting: Family Medicine

## 2023-12-16 ENCOUNTER — Encounter: Payer: Managed Care, Other (non HMO) | Admitting: Student

## 2023-12-24 ENCOUNTER — Inpatient Hospital Stay (HOSPITAL_COMMUNITY)
Admission: AD | Admit: 2023-12-24 | Discharge: 2023-12-24 | Disposition: A | Discharge status: Home / Self Care | Payer: Managed Care, Other (non HMO) | Attending: Obstetrics and Gynecology | Admitting: Obstetrics and Gynecology

## 2023-12-24 ENCOUNTER — Inpatient Hospital Stay (HOSPITAL_COMMUNITY): Payer: Managed Care, Other (non HMO)

## 2023-12-24 ENCOUNTER — Encounter (HOSPITAL_COMMUNITY): Payer: Self-pay | Admitting: *Deleted

## 2023-12-24 DIAGNOSIS — Z3A08 8 weeks gestation of pregnancy: Secondary | ICD-10-CM | POA: Insufficient documentation

## 2023-12-24 DIAGNOSIS — Z3A01 Less than 8 weeks gestation of pregnancy: Secondary | ICD-10-CM | POA: Diagnosis not present

## 2023-12-24 DIAGNOSIS — O209 Hemorrhage in early pregnancy, unspecified: Secondary | ICD-10-CM

## 2023-12-24 DIAGNOSIS — O208 Other hemorrhage in early pregnancy: Secondary | ICD-10-CM

## 2023-12-24 LAB — CBC
HCT: 33.5 % — ABNORMAL LOW (ref 36.0–46.0)
Hemoglobin: 11.9 g/dL — ABNORMAL LOW (ref 12.0–15.0)
MCH: 32.7 pg (ref 26.0–34.0)
MCHC: 35.5 g/dL (ref 30.0–36.0)
MCV: 92 fL (ref 80.0–100.0)
Platelets: 307 10*3/uL (ref 150–400)
RBC: 3.64 MIL/uL — ABNORMAL LOW (ref 3.87–5.11)
RDW: 12.1 % (ref 11.5–15.5)
WBC: 6.8 10*3/uL (ref 4.0–10.5)
nRBC: 0 % (ref 0.0–0.2)

## 2023-12-24 LAB — COMPREHENSIVE METABOLIC PANEL
ALT: 8 U/L (ref 0–44)
AST: 20 U/L (ref 15–41)
Albumin: 3.8 g/dL (ref 3.5–5.0)
Alkaline Phosphatase: 38 U/L (ref 38–126)
Anion gap: 10 (ref 5–15)
BUN: 7 mg/dL (ref 6–20)
CO2: 22 mmol/L (ref 22–32)
Calcium: 9.3 mg/dL (ref 8.9–10.3)
Chloride: 104 mmol/L (ref 98–111)
Creatinine, Ser: 0.82 mg/dL (ref 0.44–1.00)
GFR, Estimated: >60 mL/min (ref >60)
Glucose, Bld: 89 mg/dL (ref 70–99)
Potassium: 4.2 mmol/L (ref 3.5–5.1)
Sodium: 136 mmol/L (ref 135–145)
Total Bilirubin: 0.5 mg/dL (ref 0.0–1.2)
Total Protein: 7.2 g/dL (ref 6.5–8.1)

## 2023-12-24 LAB — URINALYSIS, ROUTINE W REFLEX MICROSCOPIC
Bacteria, UA: NONE SEEN
Bilirubin Urine: NEGATIVE
Glucose, UA: NEGATIVE mg/dL
Ketones, ur: NEGATIVE mg/dL
Leukocytes,Ua: NEGATIVE
Nitrite: NEGATIVE
Protein, ur: NEGATIVE mg/dL
Specific Gravity, Urine: 1.023 (ref 1.005–1.030)
pH: 7 (ref 5.0–8.0)

## 2023-12-24 LAB — WET PREP, GENITAL
Clue Cells Wet Prep HPF POC: NONE SEEN
Sperm: NONE SEEN
Trich, Wet Prep: NONE SEEN
WBC, Wet Prep HPF POC: <10 (ref <10)
Yeast Wet Prep HPF POC: NONE SEEN

## 2023-12-24 LAB — HCG, QUANTITATIVE, PREGNANCY: hCG, Beta Chain, Quant, S: 3503 m[IU]/mL — ABNORMAL HIGH (ref <5)

## 2023-12-24 NOTE — MAU Provider Note (Signed)
Chief Complaint: Vaginal Bleeding  SUBJECTIVE HPI: Jamie Wells is a 33 y.o. G2P1001 at [redacted]w[redacted]d by LMP who presents to maternity admissions reporting vaginal spotting.  Patient presents today for light vaginal bleeding.  Was at work and felt a dampness in her underwear.  Went to the bathroom and had a small spot on her underwear as well as some blood on the pad.  She notes she has not had to put on a pad.  She denies cramping or abdominal pain.  She does note that they think the date of conception was 12/30.  Denies urinary symptoms, change in discharge, vaginal itching.  Has had an initial OB confirmation visit at her primary care office but no ultrasound.  She is to establish with a [facility] for further care.  HPI  Past Medical History:  Diagnosis Date   Amenorrhea 01/28/2022   Pap smear abnormality of cervix/human papillomavirus (HPV) positive 05/17/2021   ASCUS with positive high risk HPV in 2022 with clinically normal colposcopy.  Recommend repeat Pap smear in 1 year with reflex testing.  Pap smear in 2014 and 2019 were negative.     Past Surgical History:  Procedure Laterality Date   WISDOM TOOTH EXTRACTION     Social History   Socioeconomic History   Marital status: Significant Other    Spouse name: Not on file   Number of children: Not on file   Years of education: Not on file   Highest education level: Some college, no degree  Occupational History   Not on file  Tobacco Use   Smoking status: Never   Smokeless tobacco: Never  Vaping Use   Vaping status: Never Used  Substance and Sexual Activity   Alcohol use: Not Currently    Comment: occ   Drug use: No   Sexual activity: Not Currently  Other Topics Concern   Not on file  Social History Narrative   Not on file   Social Drivers of Health   Financial Resource Strain: Low Risk  (12/08/2023)   Overall Financial Resource Strain (CARDIA)    Difficulty of Paying Living Expenses: Not hard at all  Food Insecurity: No  Food Insecurity (12/08/2023)   Hunger Vital Sign    Worried About Running Out of Food in the Last Year: Never true    Ran Out of Food in the Last Year: Never true  Transportation Needs: No Transportation Needs (12/08/2023)   PRAPARE - Administrator, Civil Service (Medical): No    Lack of Transportation (Non-Medical): No  Physical Activity: Unknown (12/08/2023)   Exercise Vital Sign    Days of Exercise per Week: 0 days    Minutes of Exercise per Session: Not on file  Stress: No Stress Concern Present (12/08/2023)   [employer] of Occupational Health - Occupational Stress Questionnaire    Feeling of Stress : Not at all  Social Connections: Moderately Isolated (12/08/2023)   Social Connection and Isolation Panel [NHANES]    Frequency of Communication with Friends and Family: More than three times a week    Frequency of Social Gatherings with Friends and Family: Once a week    Attends Religious Services: Never    Database administrator or Organizations: No    Attends Banker Meetings: Not on file    Marital Status: Living with partner  Intimate Partner Violence: Not At Risk (09/30/2022)   Humiliation, Afraid, Rape, and Kick questionnaire    Fear of Current or Ex-Partner:  No    Emotionally Abused: No    Physically Abused: No    Sexually Abused: No   No current facility-administered medications on file prior to encounter.   Current Outpatient Medications on File Prior to Encounter  Medication Sig Dispense Refill   Prenatal Vit-Fe Fumarate-FA (PRENATAL MULTIVITAMIN) TABS tablet Take 1 tablet by mouth daily at 12 noon.     No Known Allergies  ROS:  Pertinent positives/negatives listed above.  I have reviewed patient's Past Medical Hx, Surgical Hx, Family Hx, Social Hx, medications and allergies.   Physical Exam  Patient Vitals for the past 24 hrs:  BP Temp Pulse Resp Height Weight  12/24/23 1153 118/70 98.1 F (36.7 C) 82 18 5\' 3"  (1.6 m) 70.3 kg    Constitutional: Well-developed, well-nourished female in no acute distress  Cardiovascular: normal rate Respiratory: normal effort GI: Abd soft, non-tender. Pos BS x 4 MS: Extremities nontender, no edema, normal ROM Neurologic: Alert and oriented x 4  GU: Neg CVAT  LAB RESULTS Results for orders placed or performed during the hospital encounter of 12/24/23 (from the past 24 hours)  Urinalysis, Routine w reflex microscopic -Urine, Clean Catch     Status: Abnormal   Collection Time: 12/24/23 11:55 AM  Result Value Ref Range   Color, Urine YELLOW YELLOW   APPearance CLEAR CLEAR   Specific Gravity, Urine 1.023 1.005 - 1.030   pH 7.0 5.0 - 8.0   Glucose, UA NEGATIVE NEGATIVE mg/dL   Hgb urine dipstick SMALL (A) NEGATIVE   Bilirubin Urine NEGATIVE NEGATIVE   Ketones, ur NEGATIVE NEGATIVE mg/dL   Protein, ur NEGATIVE NEGATIVE mg/dL   Nitrite NEGATIVE NEGATIVE   Leukocytes,Ua NEGATIVE NEGATIVE   RBC / HPF 11-20 0 - 5 RBC/hpf   WBC, UA 0-5 0 - 5 WBC/hpf   Bacteria, UA NONE SEEN NONE SEEN   Squamous Epithelial / HPF 0-5 0 - 5 /HPF  Wet prep, genital     Status: None   Collection Time: 12/24/23 11:55 AM   Specimen: PATH Cytology Cervicovaginal Ancillary Only  Result Value Ref Range   Yeast Wet Prep HPF POC NONE SEEN NONE SEEN   Trich, Wet Prep NONE SEEN NONE SEEN   Clue Cells Wet Prep HPF POC NONE SEEN NONE SEEN   WBC, Wet Prep HPF POC <10 <10   Sperm NONE SEEN   CBC     Status: Abnormal   Collection Time: 12/24/23 12:30 PM  Result Value Ref Range   WBC 6.8 4.0 - 10.5 K/uL   RBC 3.64 (L) 3.87 - 5.11 MIL/uL   Hemoglobin 11.9 (L) 12.0 - 15.0 g/dL   HCT 40.9 (L) 81.1 - 91.4 %   MCV 92.0 80.0 - 100.0 fL   MCH 32.7 26.0 - 34.0 pg   MCHC 35.5 30.0 - 36.0 g/dL   RDW 78.2 95.6 - 21.3 %   Platelets 307 150 - 400 K/uL   nRBC 0.0 0.0 - 0.2 %  Comprehensive metabolic panel     Status: None   Collection Time: 12/24/23 12:30 PM  Result Value Ref Range   Sodium 136 135 - 145 mmol/L    Potassium 4.2 3.5 - 5.1 mmol/L   Chloride 104 98 - 111 mmol/L   CO2 22 22 - 32 mmol/L   Glucose, Bld 89 70 - 99 mg/dL   BUN 7 6 - 20 mg/dL   Creatinine, Ser 0.86 0.44 - 1.00 mg/dL   Calcium 9.3 8.9 - 57.8 mg/dL  Total Protein 7.2 6.5 - 8.1 g/dL   Albumin 3.8 3.5 - 5.0 g/dL   AST 20 15 - 41 U/L   ALT 8 0 - 44 U/L   Alkaline Phosphatase 38 38 - 126 U/L   Total Bilirubin 0.5 0.0 - 1.2 mg/dL   GFR, Estimated >16 >10 mL/min   Anion gap 10 5 - 15  hCG, quantitative, pregnancy     Status: Abnormal   Collection Time: 12/24/23 12:30 PM  Result Value Ref Range   hCG, Beta Chain, Quant, S 3,503 (H) <5 mIU/mL    B/Positive/-- (01/28 1515)  IMAGING No results found.  MAU Management/MDM: Orders Placed This Encounter  Procedures   Wet prep, genital   US OB LESS THAN 14 WEEKS WITH OB TRANSVAGINAL   Urinalysis, Routine w reflex microscopic -Urine, Clean Catch   CBC   Comprehensive metabolic panel   hCG, quantitative, pregnancy   Discharge patient    No orders of the defined types were placed in this encounter.   Patient presents with vaginal bleeding at approximately [redacted] weeks gestation.  She does not have a confirmed IUP, so ectopic workup obtained.  Ruled out UTI, vaginitis, ectopic pregnancy.  Ultrasound revealed gestational sac of 13 mm.  Discussed that this would be more consistent with a 6-week pregnancy which is also consistent with the estimated date of conception.  Also revealed a small subchorionic hemorrhage which would account for the bleeding she is seeing.  Recommended that she have a viability scan in 10 to 14 days.  I will send a message to the [facility] provider on today to get that scheduled.  If she has signs or symptoms of a complicated miscarriage, including clots greater than a golf ball, lightheaded/dizziness, fever/chills, pain out-of-control, she should come back to the MAU for sooner evaluation.  ASSESSMENT 1. [redacted] weeks gestation of pregnancy   2. Subchorionic hemorrhage  of placenta in first trimester   3. Vaginal bleeding affecting early pregnancy     PLAN Discharge home with strict return precautions. Allergies as of 12/24/2023   No Known Allergies      Medication List     TAKE these medications    prenatal multivitamin Tabs tablet Take 1 tablet by mouth daily at 12 noon.         Wylene Simmer, MD OB Fellow 12/24/2023  2:33 PM

## 2023-12-24 NOTE — MAU Note (Signed)
.  Jamie Wells is a 33 y.o. at [redacted]w[redacted]d here in MAU reporting: felt some wetness in her underwear and had some blood on it and when she wiped. Reports mild pain to her left side. No recent intercourse or exams.   LMP:  Onset of complaint: this morning Pain score: 2  Vitals:   12/24/23 1153  BP: 118/70  Pulse: 82  Resp: 18  Temp: 98.1 F (36.7 C)     FHT: n/a  Lab orders placed from triage: u/a, wet prep, gc

## 2023-12-25 LAB — GC/CHLAMYDIA PROBE AMP (~~LOC~~) NOT AT ARMC
Chlamydia: NEGATIVE
Comment: NEGATIVE
Comment: NORMAL
Neisseria Gonorrhea: NEGATIVE

## 2024-01-13 ENCOUNTER — Ambulatory Visit: Payer: Managed Care, Other (non HMO) | Admitting: Family Medicine

## 2024-01-13 VITALS — BP 110/72 | HR 85 | Wt 151.4 lb

## 2024-01-13 DIAGNOSIS — O208 Other hemorrhage in early pregnancy: Secondary | ICD-10-CM

## 2024-01-13 DIAGNOSIS — Z3491 Encounter for supervision of normal pregnancy, unspecified, first trimester: Secondary | ICD-10-CM | POA: Diagnosis not present

## 2024-01-13 DIAGNOSIS — Z3490 Encounter for supervision of normal pregnancy, unspecified, unspecified trimester: Secondary | ICD-10-CM | POA: Insufficient documentation

## 2024-01-13 LAB — POCT 1 HR PRENATAL GLUCOSE: Glucose 1 Hr Prenatal, POC: 104 mg/dL

## 2024-01-13 NOTE — Progress Notes (Signed)
 Patient Name: Jamie Wells Date of Birth: [birth year] [facility] Initial Prenatal Visit  Jamie Wells is a 33 y.o. year old G2P1001 at [redacted]w[redacted]d who presents for her initial prenatal visit. Pregnancy is not planned but welcomed She reports  no symptoms . She is taking a prenatal vitamin.  She denies pelvic pain or vaginal bleeding.   Pregnancy Dating: The patient is dated by LMP.  LMP: 10/27/2023  Period is certain:  Yes.  Periods were regular:  Yes.  LMP was a typical period:  Yes.  Using hormonal contraception in 3 months prior to conception: No  Lab Review: Blood type: B Rh Status: + Antibody screen: Negative HIV: Negative RPR: Negative Hemoglobin electrophoresis reviewed: Yes Results of OB urine culture are: Negative Rubella: Immune Hep C Ab: Negative Varicella status is Immune  PMH: Reviewed and as detailed below: HTN: No  Gestational Hypertension/preeclampsia: No  Type 1 or 2 Diabetes: No  Depression:  No  Seizure disorder:  No VTE: No ,  History of STI No,  Abnormal Pap smear:  No, Genital herpes simplex:  No   PSH: Gynecologic Surgery:  no Surgical history reviewed, notable for: n/a  Obstetric History: Obstetric history tab updated and reviewed.  Summary of prior pregnancies: G2P1001, normal term vaginal birth, no complications Cesarean delivery: No  Gestational Diabetes:  No Hypertension in pregnancy: No History of preterm birth: No History of LGA/SGA infant:  No History of shoulder dystocia: No Indications for referral were reviewed, and the patient has no obstetric indications for referral to High Risk OB Clinic at this time.   Social History: Partner's name: Colt  Tobacco use: No Alcohol use:  No Other substance use:  No  Current Medications:  Prenatal vitamin  Reviewed and appropriate in pregnancy.   Genetic and Infection Screen: Flow Sheet Updated Yes  Prenatal Exam: General: NAD, pleasant, able to participate  in exam Cardiac: RRR, no murmurs auscultated Respiratory: CTAB, normal WOB Abdomen: soft, non-tender, non-distended Extremities: warm and well perfused, no edema or cyanosis Skin: warm and dry, no rashes noted Neuro: alert, no obvious focal deficits, speech normal Psych: Normal affect and mood  Fetal heart tones: not evaluated today; pending formal ultrasound  Assessment/Plan:  [place] N Guilliams is a 33 y.o. G2P1001 at [redacted]w[redacted]d who presents to initiate prenatal care. She is doing well.  Current pregnancy issues include subchorionic hemorrhage noted at MAU visit in February.  Has not had any vaginal bleeding or spotting since then.  Denies concerns today.  Routine prenatal care: Dating reliable, but ordered ultrasound for viability given subchorionic hemorrhage noted at MAU visit 1 mo ago Pre-pregnancy weight updated. Expected weight gain this pregnancy is 25-35 pounds  Prenatal labs reviewed, no concerns. Indications for referral to [facility] were reviewed and the patient does not meet criteria for referral.  Medication list reviewed and updated.  Recommended patient see a dentist for regular care.  Bleeding and pain precautions reviewed. Importance of prenatal vitamins reviewed.  Genetic screening offered. Patient opted for: quad screen at 16-20 weeks. The patient has the following indications for aspirinto begin 81 mg at 12-16 weeks: One high risk condition: no single high risk condition  MORE than one moderate risk condition: family history of preeclampsia (mother or sister)  and identifies as African American  Aspirin was  recommended today based upon above risk factors (one high risk condition or more than one moderate risk factor)  The patient will not be age 79 or over at  time of delivery. Referral to genetic counseling was not offered today.  The patient has the following risk factors for preexisting diabetes: BMI > 25 and high risk ethnicity (Latino, [place] American, Native American,  Mal[place] Islander, Asian Naval architect) . An early 1 hour glucose tolerance test was ordered. Pregnancy Medical Home and PHQ-9 forms completed, problems noted: No  2. Pregnancy issues include the following which were addressed today:  [place] scheduled 3/11 given small subchorionic hemorrhage noted at MAU visit last month Early GTT today Desires genetic screening, f/u at next visit Start ASA at next visit   Follow up 4 weeks for next prenatal visit. Scheduled w/ me 4/3

## 2024-01-13 NOTE — Progress Notes (Signed)
 Notes: Came into NEW OB visit to congratulate and meet mom. Discussed how [employer] can provide connection to resources in the area that mom may not be aware of as well as offer support throughout the longevity of the pregnancy. Patient consented to Prenatal Navigation. Client didn't express any concerns or needs today. CN scheduled to chat with mom on 01/19/24 to complete Guided Conversation.    Tamarah Bhullar Ladona Ridgel Community Navigator [facility] [employer] [place] Direct Dial: [phone]

## 2024-01-13 NOTE — Patient Instructions (Signed)
 Your follow-up appointment is scheduled in about 4 weeks  Prenatal Classes Go to [email or web address] for more information on the pregnancy and child birth classes that [place] has to offer.   Vaccinations If you are with the Adopt a Mom Program and need vaccines, these are done the [facility] Department on [address] [place]. The number to call to schedule an appt is [phone]  Pregnancy Related Return Precautions The follow are signs/symptoms that are abnormal in pregnancy and may require further evaluation by a physician: Go to the MAU at [facility] at [facility] if: You have cramping/contractions that do not go away with drinking water, especially if they are lasting 30 seconds to 1.5 minutes, coming and going every 5-10 minutes for an hour or more, or are getting stronger and you cannot walk or talk while having a contraction/cramp. Your water breaks.  Sometimes it is a big gush of fluid, sometimes it is just a trickle that keeps getting your underwear wet or running down your legs You have vaginal bleeding.    You do not feel your baby moving like normal.  If you do not, get something to eat and drink (something cold or something with sugar like peanut butter or juice) and lay down and focus on feeling your baby move. If your baby is still not moving like normal, you should go to MAU. You should feel your baby move 6 times in one hour, or 10 times in two hours. You have a persistent headache that does not go away with 1 g of Tylenol, vision changes, chest pain, difficulty breathing, severe pain in your right upper abdomen, worsening leg swelling- these can all be signs of high blood pressure in pregnancy and need to be evaluated by a provider immediately  These are all concerning in pregnancy and if you have any of these I recommend you call your PCP and present to the Maternity Admissions Unit (map below) for further  evaluation.  For any pregnancy-related emergencies, please go to the Maternity Admissions Unit in the [facility] at [facility]. You will use hospital Entrance C.    Our clinic number is [phone].

## 2024-01-19 ENCOUNTER — Telehealth: Payer: Self-pay

## 2024-01-19 NOTE — Telephone Encounter (Signed)
 Reached out to client via telephone call to complete Guided Conversation as scheduled. Client reports that she is no longer in need of Navigation services as she has lost the pregnancy. Offered condolences as well as grief/loss resources which were declined - encouraged mom to reach out to CN anytime for support.  Nazly Digilio Ladona Ridgel Community Navigator [facility] [employer] [place] Direct Dial: [phone]

## 2024-01-20 ENCOUNTER — Ambulatory Visit (HOSPITAL_COMMUNITY)

## 2024-01-22 ENCOUNTER — Telehealth: Payer: Self-pay | Admitting: Family Medicine

## 2024-01-22 NOTE — Telephone Encounter (Signed)
 Called patient to discuss concern for loss of pregnancy.  Patient states that she initially thought that she lost her pregnancy because she took a repeat pregnancy test that was negative and she felt that after her MAU visit with vaginal bleeding this meant that her pregnancy was lost.  As a result patient canceled her ultrasound appointment.  However, today patient states that she no longer thinks that she lost her pregnancy as she has been asymptomatic.  Denies any vaginal bleeding or pain.  She is willing to call MFM to reschedule her ultrasound and follow-up with [place] as scheduled.  I provided phone number for this.  Discussed return/MAU precautions for recurring symptoms and advised that we can see her sooner than her scheduled visit to perform additional testing and evaluation.  States that she would send a message or let me know if this is desired.  Vonna Drafts, MD 01/22/24 12:30 PM

## 2024-01-26 ENCOUNTER — Encounter: Payer: Self-pay | Admitting: Family Medicine

## 2024-01-27 NOTE — Telephone Encounter (Signed)
 Called patient to discuss.  She is still bleeding.  Suspect that this is most likely miscarriage but advised that she go to MAU given that we cannot necessarily rule out things like heterotopic/ectopic pregnancy, etc without formal ultrasound.  Patient expressed understanding and plans to go to MAU today. No further concerns.  Vonna Drafts, MD

## 2024-01-28 ENCOUNTER — Telehealth: Payer: Self-pay

## 2024-01-28 ENCOUNTER — Inpatient Hospital Stay (HOSPITAL_COMMUNITY)
Admission: AD | Admit: 2024-01-28 | Discharge: 2024-01-28 | Disposition: A | Discharge status: Home / Self Care | Attending: Obstetrics and Gynecology | Admitting: Obstetrics and Gynecology

## 2024-01-28 ENCOUNTER — Encounter (HOSPITAL_COMMUNITY): Payer: Self-pay | Admitting: *Deleted

## 2024-01-28 DIAGNOSIS — O039 Complete or unspecified spontaneous abortion without complication: Secondary | ICD-10-CM | POA: Diagnosis not present

## 2024-01-28 DIAGNOSIS — Z3A00 Weeks of gestation of pregnancy not specified: Secondary | ICD-10-CM | POA: Diagnosis not present

## 2024-01-28 DIAGNOSIS — O219 Vomiting of pregnancy, unspecified: Secondary | ICD-10-CM | POA: Diagnosis present

## 2024-01-28 LAB — POCT PREGNANCY, URINE: Preg Test, Ur: NEGATIVE

## 2024-01-28 NOTE — MAU Provider Note (Signed)
 Event Date/Time   First Provider Initiated Contact with Patient 01/28/24 [id number]      S Ms. Jamie Wells is a 33 y.o. G2P1011 patient who presents to MAU today with complaint of being seen in MAU on 12/23/2013 for vaginal bleeding in pregnancy. She was diagnosed with a SCH at that time. She was advised to have repeat viability U/S in 10-14 days and the MAU note was transmitted to [facility] (the patient's previous OB provider). She was not aware the follow-up was supposed to be arranged by [facility], so she did not call them to follow-up either. She had her 1st prenatal visit with [facility] on 01/13/2024. She states she lost pregnancy that day and had a negative HPT that night. She called her OB provider on 01/20/2024 to tell them she thinks she lost the pregnancy and was advised she would just need a repeat U/S; which is scheduled for 01/29/2024. She reports she started having vaginal bleeding on 01/24/2024. She thinks this was her period. The bleeding has continued through today, but in a small amount. This pattern mimics her normal period. She reports having intermittent "[place]" pain in the LLQ; which also mimics her normal period cramps. She receives [facility]; next appt is 02/12/2024.   O BP 121/89 (BP Location: Right Arm)   Pulse 73   Temp 98.8 F (37.1 C) (Oral)   Resp 16   Ht 5\' 3"  (1.6 m)   Wt 67.2 kg   LMP 01/24/2024 (Exact Date)   SpO2 100%    BMI 26.23 kg/m   Physical Exam Vitals and nursing note reviewed.  Constitutional:      Appearance: Normal appearance. She is normal weight.  Cardiovascular:     Rate and Rhythm: Normal rate.  Pulmonary:     Effort: Pulmonary effort is normal.  Musculoskeletal:        General: Normal range of motion.  Neurological:     Mental Status: She is alert and oriented to person, place, and time.  Psychiatric:        Mood and Affect: Mood normal.        Behavior: Behavior normal.        Thought Content: Thought  content normal.        Judgment: Judgment normal.     A Medical screening exam complete 1. Miscarriage (Primary)   P Discharge from MAU in stable condition Patient's F/U U/S and ROB appts cancelled Advised to F/U with [facility] in 2 weeks for SAB Advised to consider BC method, if not TTC soon after this SAB Warning signs for worsening condition that would warrant emergency follow-up discussed Patient may return to MAU as needed for pregnancy concerns Message will be sent to her provider Vonna Drafts, MD)  Raelyn Mora, CNM 01/28/2024 9:28 AM

## 2024-01-28 NOTE — MAU Note (Addendum)
 Jamie Wells is a 33 y.o. at ? here in MAU reporting: was here 2/12 for bleeding in preg. Was dx with subchorionic hemorrhage.  First prenatal 3/4, no longer having symptoms, preg test was neg that night. 3/15 started bleeding, thought she was getting her period. Continued to bleed, now small amt.  Having a Imm pain in LLQ on and off.  Has appt for [place] tomorrow. ([facility] radiology/US) LMP: ? New period or ongoing bleeding with preg Onset of complaint: 3/15 Pain score: mild Vitals:   01/28/24 0914  BP: 121/89  Pulse: 73  Resp: 16  Temp: 98.8 F (37.1 C)  SpO2: 100%      Lab orders placed from triage:  UPT is neg

## 2024-01-28 NOTE — Telephone Encounter (Signed)
-----   Message from [facility] sent at 01/28/2024 10:05 AM EDT ----- Regarding: FW: Needs to be seen Please call pt to schedule appt in 2 weeks  Thanks Atif ----- Message ----- From: Raelyn Mora, CNM Sent: 01/28/2024  10:02 AM EDT To: Vonna Drafts, MD Subject: Needs to be seen                               This patient has had a SAB. Please have your office schedule her to see you or another provider in 2 weeks. She is on her period now. Her first day her LMP was 01/24/2024.  Thank you,  Raelyn Mora, CNM

## 2024-01-28 NOTE — Telephone Encounter (Signed)
 Spoke with patient. Made 2week appt for 4/4 at 9:30. Aquilla Solian, CMA

## 2024-01-29 ENCOUNTER — Ambulatory Visit (HOSPITAL_COMMUNITY): Admission: RE | Admit: 2024-01-29 | Source: Ambulatory Visit

## 2024-01-29 ENCOUNTER — Encounter (HOSPITAL_COMMUNITY): Payer: Self-pay

## 2024-02-12 ENCOUNTER — Encounter: Payer: Self-pay | Admitting: Family Medicine

## 2024-02-13 ENCOUNTER — Encounter: Payer: Self-pay | Admitting: Family Medicine

## 2024-02-13 ENCOUNTER — Ambulatory Visit: Admitting: Family Medicine

## 2024-02-13 VITALS — BP 120/80 | HR 84 | Ht 63.0 in | Wt 148.0 lb

## 2024-02-13 DIAGNOSIS — Z23 Encounter for immunization: Secondary | ICD-10-CM | POA: Diagnosis not present

## 2024-02-13 DIAGNOSIS — Z0000 Encounter for general adult medical examination without abnormal findings: Secondary | ICD-10-CM | POA: Diagnosis not present

## 2024-02-13 DIAGNOSIS — O039 Complete or unspecified spontaneous abortion without complication: Secondary | ICD-10-CM

## 2024-02-13 NOTE — Patient Instructions (Addendum)
 Glad you are doing well. Let [place] know if you are interested in any birth control options

## 2024-02-13 NOTE — Progress Notes (Signed)
    SUBJECTIVE:   CHIEF COMPLAINT / HPI:   G2P1011 had spontaneous abortion about 2wks ago Doing well, no concerns Not interested in contraception at this time No abd pain, fevers No longer bleeding - her bleeding lasted about 4-5 days. No abnormally large clots  Typically has periods every month States her mood was a Munoz down immediately after her miscarriage but has been very well supported by family and friends, currently denies any symptoms of depression and feels well   PERTINENT  PMH / PSH: n/a  OBJECTIVE:   BP 120/80   Pulse 84   Ht 5\' 3"  (1.6 m)   Wt 148 lb (67.1 kg)   LMP 01/24/2024 (Exact Date)   SpO2 99%   BMI 26.22 kg/m   General: NAD, pleasant, able to participate in exam Cardiac: RRR, no murmurs auscultated Respiratory: CTAB, normal WOB Abdomen: soft, non-tender, non-distended, normoactive bowel sounds Extremities: warm and well perfused, no edema or cyanosis Skin: warm and dry, no rashes noted Neuro: alert, no obvious focal deficits, speech normal Psych: Normal affect and mood  ASSESSMENT/PLAN:   Assessment & Plan Miscarriage Expectant management Doing well, no concerns today Advised to consider contraception if desired  Discussed precautions regarding bleeding, abd pain Discussed using prenatal vitamin/folate if/when trying again for pregnancy F/u prn  HPV shot given today  Vonna Drafts, MD [facility]

## 2024-02-16 ENCOUNTER — Telehealth: Payer: Self-pay

## 2024-02-23 NOTE — Telephone Encounter (Addendum)
 Notes: Reached out to client via phone call to offer grief support once more - unable to reach, left vm requesting call back if support was needed. Received text message giving consent to send resources available. No response from client when asked if any other needs were present in household. Encouraged client to reach out If add't support is needed. Closing Navigation account due to loss of pregnancy.  Resources/ Materials provided: [facility], PsychologyToday Website, [facility] Adult MH Resource List, [facility] Helpline/ Support Groups, [facility] Maternal Health Web Links, [employer] 'Coping' document  Jamie Wells [facility] [place] Direct Dial: [phone]

## 2024-05-12 ENCOUNTER — Ambulatory Visit

## 2024-05-12 VITALS — BP 100/75 | HR 82 | Temp 98.7°F | Wt 151.4 lb

## 2024-05-12 DIAGNOSIS — Z3481 Encounter for supervision of other normal pregnancy, first trimester: Secondary | ICD-10-CM | POA: Diagnosis not present

## 2024-05-12 DIAGNOSIS — N926 Irregular menstruation, unspecified: Secondary | ICD-10-CM | POA: Diagnosis not present

## 2024-05-12 LAB — POCT URINE PREGNANCY: Preg Test, Ur: POSITIVE — AB

## 2024-05-12 NOTE — Patient Instructions (Signed)
 Please return for your next OB visit in 4 weeks.   We drew some labs today and we scheduled an ultrasound to confirm your due date. If you have any abdominal pain, cramping, or vaginal bleeding, you can go to the maternity assessment unit for evaluation.  Here are some medications that are safe in pregnancy and can help with your nausea: Nausea/Vomiting:  Recommend Vitamin B6 100mg  tablets. Take one tablet twice a day (up to 200 mg per day).  Bonine  Dramamine  Emetrol  Ginger extract  Sea bands  Meclizine  Unisom (doxylamine succinate 25 mg tablets) Take one tablet daily at bedtime. If symptoms are not adequately controlled, the dose can be increased to a maximum recommended dose of two tablets daily (1/2 tablet in the morning, 1/2 tablet mid-afternoon and one at bedtime).    Prenatal Classes Go to [email or web address] for more information on the pregnancy, breastfeeding, and child birth classes that [place] has to offer.   Safe Medications in Pregnancy   Acne:  Benzoyl Peroxide  Salicylic Acid   Backache/Headache:  Tylenol : 2 regular strength every 4 hours OR               2 extra strength every 6 hours  ** Do not exceed 4000 mg of tylenol  in 24 hours   Colds/Coughs/Allergies:  Benadryl  (alcohol free) 25 mg every 6 hours as needed  Breath right strips  Claritin   Cepacol throat lozenges  Chloraseptic throat spray  Cold-Eeze- up to three times per day  Cough drops, alcohol free  Flonase  (by prescription only)  Guaifenesin  Mucinex  Robitussin DM (plain only, alcohol free)  Saline nasal spray/drops  Sudafed (pseudoephedrine) & Actifed * use only after [redacted] weeks gestation and if you do not have high blood pressure  Tylenol   Vicks Vaporub  Zinc lozenges  Zyrtec   Constipation:  Colace  Ducolax suppositories  Fleet enema  Glycerin  suppositories  Metamucil  Milk of magnesia  Miralax   Senokot  Smooth move tea   Diarrhea:  Kaopectate  Imodium A-D  **NO  pepto Bismol   Hemorrhoids:  Anusol  Anusol HC  Preparation H  Tucks   Indigestion:  Tums  Maalox  Mylanta  Zantac  Pepcid    Insomnia:  Benadryl  (alcohol free) 25mg  every 6 hours as needed  Tylenol  PM  Unisom, no Gelcaps   Leg Cramps:  Tums  MagGel   Skin Rashes:  Aveeno products  Benadryl  cream or 25mg  every 6 hours as needed  Calamine Lotion  1% cortisone cream   Yeast infection:  Gyne-lotrimin 7  Monistat 7   **If taking multiple medications, please check labels to avoid duplicating the same active ingredients  **Take medications as directed on the label  **Do not take medications that contain ibuprofen  or aspirin without discussing with your physician

## 2024-05-12 NOTE — Progress Notes (Signed)
 Patient Name: Jamie Wells Date of Birth: Nov 19, 1990 [facility] Initial Prenatal Visit  Jamie Wells is a 33 y.o. year old G2P1011 at Unknown who presents for her initial prenatal visit. She is happy about her pregnancy. She reports nausea despite trying to eat many small meals a day and positive home pregnancy test. She is taking a prenatal vitamin.  She denies pelvic pain or vaginal bleeding.   Pregnancy Dating: LMP: Mar 18, 2024 Period is certain:  Yes.  Periods were regular:  No.  LMP was a typical period:  Yes.  Using hormonal contraception in 3 months prior to conception: No  Lab Review: Initial lab results pending. Review Hgb electrophoresis from 2023, normal. Reports she had chickenpox in her youth.  PMH: Reviewed and as detailed below: HTN: No  Gestational Hypertension/preeclampsia: No  Type 1 or 2 Diabetes: No  Depression:  No  Seizure disorder:  No VTE: No ,  History of STI No,  Abnormal Pap smear:  Yes, one a couple years ago, last 12/24 was normal Genital herpes simplex:  No   PSH: Gynecologic Surgery:  no Surgical history reviewed, notable for: no  Obstetric History: Obstetric history tab updated and reviewed.  Summary of prior pregnancies: G3P1011 Cesarean delivery: No  Gestational Diabetes:  No Hypertension in pregnancy: Yes, met gHTN criteria during labor History of preterm birth: No History of LGA/SGA infant:  No History of shoulder dystocia: No Indications for referral were reviewed, and the patient has no obstetric indications for referral to High Risk OB Clinic at this time.   Social History: Partner's name: Ramonia  Tobacco use: No Alcohol use:  No Other substance use:  No  Current Medications:  Taking prenatal vitamin since 1/25 since her G2 pregnancy Reviewed and appropriate in pregnancy.   Prenatal Exam: Gen: Well nourished, well developed.  No distress.  Vitals noted. CV: RRR no murmur, gallops or  rubs Lungs: CTA B.  Normal respiratory effort without wheezes or rales. Abd: soft, NTND. Uterus not appreciated above pelvis. Ext: 2+ radial pulse Psych: Normal grooming and dress.  Not depressed or anxious appearing.  Normal thought content and process without flight of ideas or looseness of associations  Assessment/Plan:  Jamie Wells is a 33 y.o. G2P1011 at Unknown who presents to initiate prenatal care. She is doing well.  Current pregnancy issues include nausea.  Routine prenatal care: As dating is not reliable, a dating ultrasound has been ordered. Dating tab updated. Pre-pregnancy weight updated. Expected weight gain this pregnancy is 25-35 pounds  Prenatal labs ordered today. Indications for referral to HR[facility] were reviewed and the patient does not meet criteria for referral.  Medication list reviewed and updated.  Bleeding and pain precautions reviewed. Importance of prenatal vitamins reviewed.  The patient has the following indications for aspirinto begin 81 mg at 12-16 weeks: One high risk condition: no single high risk condition  MORE than one moderate risk condition: identifies as African American  and prior adverse pregnancy outcome  Aspirin was  recommended today based upon above risk factors (more than one moderate risk factor) to be started after 12 weeks. Will follow up on this after dating ultrasound. The patient will be age 19 or over at time of delivery. Consider referral to genetic counseling at follow up. The patient has the following risk factors for preexisting diabetes: BMI > 25 and high risk ethnicity (Latino, [place] American, Native American, [place] Islander, Asian Naval architect) . An early 1 hour glucose  tolerance test will be done at next visit.  2. Pregnancy issues include the following which were addressed today:  Nausea - recommended Vitamin B6 daily, provided safe medications in pregnancy hand out At follow up: perform pelvic exam for GC/Chlamydia, follow  up on aspirin recommendation, order 1  hour GTT, consider genetic counseling referral   Follow up 4 weeks for next prenatal visit.

## 2024-05-15 LAB — CBC/D/PLT+RPR+RH+ABO+RUBIGG...
Antibody Screen: NEGATIVE
Basophils Absolute: 0 x10E3/uL (ref 0.0–0.2)
Basos: 0 %
Bilirubin, UA: NEGATIVE
EOS (ABSOLUTE): 0.1 x10E3/uL (ref 0.0–0.4)
Eos: 1 %
Glucose, UA: NEGATIVE
HCV Ab: NONREACTIVE
HIV Screen 4th Generation wRfx: NONREACTIVE
Hematocrit: 37.4 % (ref 34.0–46.6)
Hemoglobin: 12.4 g/dL (ref 11.1–15.9)
Hepatitis B Surface Ag: NEGATIVE
Immature Grans (Abs): 0 x10E3/uL (ref 0.0–0.1)
Immature Granulocytes: 0 %
Ketones, UA: NEGATIVE
Leukocytes,UA: NEGATIVE
Lymphocytes Absolute: 1.4 x10E3/uL (ref 0.7–3.1)
Lymphs: 22 %
MCH: 32.5 pg (ref 26.6–33.0)
MCHC: 33.2 g/dL (ref 31.5–35.7)
MCV: 98 fL — ABNORMAL HIGH (ref 79–97)
Monocytes Absolute: 0.3 x10E3/uL (ref 0.1–0.9)
Monocytes: 5 %
Neutrophils Absolute: 4.5 x10E3/uL (ref 1.4–7.0)
Neutrophils: 72 %
Nitrite, UA: NEGATIVE
Platelets: 328 x10E3/uL (ref 150–450)
RBC, UA: NEGATIVE
RBC: 3.82 x10E6/uL (ref 3.77–5.28)
RDW: 12.9 % (ref 11.7–15.4)
RPR Ser Ql: NONREACTIVE
Rh Factor: POSITIVE
Rubella Antibodies, IGG: 3.02 {index} (ref >0.99)
Specific Gravity, UA: 1.024 (ref 1.005–1.030)
Urobilinogen, Ur: 0.2 mg/dL (ref 0.2–1.0)
WBC: 6.3 x10E3/uL (ref 3.4–10.8)
pH, UA: 7 (ref 5.0–7.5)

## 2024-05-15 LAB — MICROSCOPIC EXAMINATION
Casts: NONE SEEN /LPF
WBC, UA: NONE SEEN /HPF (ref 0–5)

## 2024-05-15 LAB — HCV INTERPRETATION

## 2024-05-15 LAB — URINE CULTURE, OB REFLEX

## 2024-05-17 ENCOUNTER — Ambulatory Visit: Payer: Self-pay

## 2024-05-17 DIAGNOSIS — O99891 Other specified diseases and conditions complicating pregnancy: Secondary | ICD-10-CM

## 2024-05-17 MED ORDER — AMOXICILLIN 875 MG PO TABS: 875.0000 mg | ORAL_TABLET | Freq: Two times a day (BID) | ORAL | 0 refills | Status: AC

## 2024-05-25 ENCOUNTER — Ambulatory Visit (HOSPITAL_COMMUNITY)

## 2024-06-01 ENCOUNTER — Encounter: Payer: Self-pay | Admitting: *Deleted

## 2024-09-24 ENCOUNTER — Encounter (HOSPITAL_COMMUNITY): Payer: Self-pay | Admitting: *Deleted

## 2024-09-24 ENCOUNTER — Other Ambulatory Visit: Payer: Self-pay

## 2024-09-24 ENCOUNTER — Inpatient Hospital Stay (HOSPITAL_COMMUNITY)
Admission: AD | Admit: 2024-09-24 | Discharge: 2024-09-24 | Disposition: A | Discharge status: Home / Self Care | Attending: Obstetrics and Gynecology | Admitting: Obstetrics and Gynecology

## 2024-09-24 DIAGNOSIS — Z3A27 27 weeks gestation of pregnancy: Secondary | ICD-10-CM | POA: Insufficient documentation

## 2024-09-24 DIAGNOSIS — O26892 Other specified pregnancy related conditions, second trimester: Secondary | ICD-10-CM | POA: Diagnosis not present

## 2024-09-24 DIAGNOSIS — R1022 Pelvic and perineal pain left side: Secondary | ICD-10-CM | POA: Diagnosis present

## 2024-09-24 DIAGNOSIS — Z8759 Personal history of other complications of pregnancy, childbirth and the puerperium: Secondary | ICD-10-CM | POA: Insufficient documentation

## 2024-09-24 DIAGNOSIS — R10A2 Flank pain, left side: Secondary | ICD-10-CM | POA: Insufficient documentation

## 2024-09-24 DIAGNOSIS — R102 Pelvic and perineal pain unspecified side: Secondary | ICD-10-CM

## 2024-09-24 LAB — COMPREHENSIVE METABOLIC PANEL WITH GFR
ALT: 8 U/L (ref 0–44)
AST: 19 U/L (ref 15–41)
Albumin: 2.9 g/dL — ABNORMAL LOW (ref 3.5–5.0)
Alkaline Phosphatase: 49 U/L (ref 38–126)
Anion gap: 10 (ref 5–15)
BUN: 5 mg/dL — ABNORMAL LOW (ref 6–20)
CO2: 21 mmol/L — ABNORMAL LOW (ref 22–32)
Calcium: 8.7 mg/dL — ABNORMAL LOW (ref 8.9–10.3)
Chloride: 102 mmol/L (ref 98–111)
Creatinine, Ser: 0.62 mg/dL (ref 0.44–1.00)
GFR, Estimated: >60 mL/min (ref >60)
Glucose, Bld: 68 mg/dL — ABNORMAL LOW (ref 70–99)
Potassium: 3.6 mmol/L (ref 3.5–5.1)
Sodium: 133 mmol/L — ABNORMAL LOW (ref 135–145)
Total Bilirubin: 0.3 mg/dL (ref 0.0–1.2)
Total Protein: 6.6 g/dL (ref 6.5–8.1)

## 2024-09-24 LAB — URINALYSIS, ROUTINE W REFLEX MICROSCOPIC
Bilirubin Urine: NEGATIVE
Glucose, UA: NEGATIVE mg/dL
Hgb urine dipstick: NEGATIVE
Ketones, ur: NEGATIVE mg/dL
Leukocytes,Ua: NEGATIVE
Nitrite: NEGATIVE
Protein, ur: NEGATIVE mg/dL
Specific Gravity, Urine: 1.025 (ref 1.005–1.030)
pH: 6 (ref 5.0–8.0)

## 2024-09-24 LAB — CBC WITH DIFFERENTIAL/PLATELET
Abs Immature Granulocytes: 0.02 K/uL (ref 0.00–0.07)
Basophils Absolute: 0 K/uL (ref 0.0–0.1)
Basophils Relative: 0 %
Eosinophils Absolute: 0.1 K/uL (ref 0.0–0.5)
Eosinophils Relative: 1 %
HCT: 31.8 % — ABNORMAL LOW (ref 36.0–46.0)
Hemoglobin: 11.3 g/dL — ABNORMAL LOW (ref 12.0–15.0)
Immature Granulocytes: 0 %
Lymphocytes Relative: 16 %
Lymphs Abs: 1.4 K/uL (ref 0.7–4.0)
MCH: 32.6 pg (ref 26.0–34.0)
MCHC: 35.5 g/dL (ref 30.0–36.0)
MCV: 91.6 fL (ref 80.0–100.0)
Monocytes Absolute: 0.5 K/uL (ref 0.1–1.0)
Monocytes Relative: 5 %
Neutro Abs: 6.7 K/uL (ref 1.7–7.7)
Neutrophils Relative %: 78 %
Platelets: 265 K/uL (ref 150–400)
RBC: 3.47 MIL/uL — ABNORMAL LOW (ref 3.87–5.11)
RDW: 12.9 % (ref 11.5–15.5)
WBC: 8.6 K/uL (ref 4.0–10.5)
nRBC: 0 % (ref 0.0–0.2)

## 2024-09-24 NOTE — MAU Provider Note (Addendum)
 History   Shaquisha Wynn Quinney is a 33 y.o. year old G2P1011 at [redacted]w[redacted]d who presents for acute onset left flank pain.   This current pregnancy is low risk and without complications. She has a history of subchorionic hematoma and spontaneous miscarriage.   Patient Active Problem List   Diagnosis Date Noted   Supervision of low-risk pregnancy 01/13/2024   Normal labor 09/30/2022   Cervical cancer screening 05/03/2021   Eczema 01/08/2007    Chief Complaint  Patient presents with   Abdominal Pain    Sharp pain on left side   Abdominal Pain Pertinent negatives include no constipation, diarrhea, dysuria, fever, headaches or hematuria.   She states she began experiencing 4/10 intermittent sharp pain localized to the left upper flank pain this morning around 0900 while driving to work. The pain was slightly worsened with movement and improved with rest. She has never experienced anything like this before. She reports normal fetal movement, denies any dysuria, hematuria, suprapubic pain, RUQ pain, headache, changes in vision, vaginal bleeding, discharge or LOF. She denies any other concerns at this time.   OB History     Gravida  3   Para  1   Term  1   Preterm      AB  1   Living  1      SAB  1   IAB      Ectopic      Multiple  0   Live Births  1           Past Medical History:  Diagnosis Date   Amenorrhea 01/28/2022   Pap smear abnormality of cervix/human papillomavirus (HPV) positive 05/17/2021   ASCUS with positive high risk HPV in 2022 with clinically normal colposcopy.  Recommend repeat Pap smear in 1 year with reflex testing.  Pap smear in 2014 and 2019 were negative.      Past Surgical History:  Procedure Laterality Date   WISDOM TOOTH EXTRACTION      Family History  Problem Relation Age of Onset   Healthy Mother     Social History   Tobacco Use   Smoking status: Never   Smokeless tobacco: Never  Vaping Use   Vaping status: Never Used   Substance Use Topics   Alcohol use: Not Currently    Comment: occ   Drug use: No    Allergies: No Known Allergies  Medications Prior to Admission  Medication Sig Dispense Refill Last Dose/Taking   Prenatal Vit-Fe Fumarate-FA (PRENATAL MULTIVITAMIN) TABS tablet Take 1 tablet by mouth daily at 12 noon.   09/23/2024    Review of Systems  Constitutional:  Negative for chills and fever.  Eyes:  Negative for blurred vision and double vision.  Respiratory:  Negative for shortness of breath.   Cardiovascular:  Negative for chest pain and leg swelling.  Gastrointestinal:  Positive for abdominal pain and heartburn. Negative for blood in stool, constipation and diarrhea.  Genitourinary:  Negative for dysuria, hematuria and urgency.  Musculoskeletal:  Negative for back pain.  Skin:  Negative for itching.  Neurological:  Negative for headaches.    See HPI Above Physical Exam   Blood pressure 113/72, pulse 77, temperature 97.6 F (36.4 C), temperature source Oral, resp. rate 16, height 5' 3 (1.6 m), weight 70.5 kg, last menstrual period 03/18/2024, SpO2 100%, unknown if currently breastfeeding.  No results found for this or any previous visit (from the past 24 hours).  Physical Exam Constitutional:  General: She is not in acute distress.    Appearance: She is well-developed. She is not ill-appearing, toxic-appearing or diaphoretic.  Cardiovascular:     Rate and Rhythm: Normal rate.     Heart sounds: Normal heart sounds.  Pulmonary:     Effort: Pulmonary effort is normal.     Breath sounds: Normal breath sounds.  Abdominal:     General: Bowel sounds are normal.     Palpations: Abdomen is soft.     Tenderness: There is no abdominal tenderness. There is no right CVA tenderness, left CVA tenderness or guarding.     Comments: Gravid  Genitourinary:    Vagina: No vaginal discharge or bleeding.     Uterus: Not tender.      Comments: Cervical exam performed with chaperone. Cervical  os closed. Skin:    General: Skin is warm and dry.  Neurological:     Mental Status: She is alert.  Psychiatric:        Mood and Affect: Mood normal.        Behavior: Behavior normal.      FHR:140 bpm, Mod Var, -Decels, +Accels (10x10)  ED Course  Assessment:  Tzirel Leonor Kimmel is a 33 y.o. year old G2P1011 at [redacted]w[redacted]d who presents for acute onset left flank pain. Pain resolved spontaneously during this visit. She denies symptomology indicative of UTI or pyelonephritis, and no symptoms concerning for premature labor. FHT reassuring. Cervical os closed. CBC, CMP and UA unremarkable.  Plan: - Patient reassured. Discharge to home.  Follow Up (Time) - Routine prenatal care.   Nunzio DELENA Cena Verma Medical Student 09/24/2024 11:26 AM  Attestation of Supervision of Student:  I confirm that I have verified the information documented in the medical student's note and that I have also personally reperformed the history, physical exam and all medical decision making activities.  I have verified that all services and findings are accurately documented in this student's note; and I agree with management and plan as outlined in the documentation. I have also made any necessary editorial changes.  Cervix closed, thick, and posterior.  Offered and patient declined tylenol .  Fetal Tracing:  Baseline: 130 bpm Variability: moderate  Accelerations: 15x15 Decelerations: None Toco: None  Delon Emms, NP [facility] [employer] 09/24/2024 8:07 PM

## 2024-09-24 NOTE — MAU Note (Addendum)
 Jamie Wells is a 33 y.o. at [redacted]w[redacted]d here in MAU reporting: sharp pain on right lateral abd starting around 9am,  She reports +FMs, Denies LOF, VB, regular contractions, blurry vision, headaches, peripheral edema, or RUQ pain. No c/o UTI symptoms.  LMP: na Onset of complaint: 0900 Pain score: 4/10 Vitals:   09/24/24 1033  BP: 113/72  Pulse: 77  Resp: 16  Temp: 97.6 F (36.4 C)  SpO2: 100%     FHT: to room   Lab orders placed from triage: ua
